# Patient Record
Sex: Female | Born: 1975 | Race: White | Hispanic: No | Marital: Married | Smoking: Former smoker
Health system: Southern US, Community
[De-identification: ages and names within clinical notes are randomized; demographics above are authoritative.]

## PROBLEM LIST (undated history)

## (undated) DIAGNOSIS — IMO0002 Reserved for concepts with insufficient information to code with codable children: Secondary | ICD-10-CM

## (undated) DIAGNOSIS — N189 Chronic kidney disease, unspecified: Secondary | ICD-10-CM

## (undated) DIAGNOSIS — K219 Gastro-esophageal reflux disease without esophagitis: Secondary | ICD-10-CM

## (undated) DIAGNOSIS — I10 Essential (primary) hypertension: Secondary | ICD-10-CM

## (undated) DIAGNOSIS — F419 Anxiety disorder, unspecified: Secondary | ICD-10-CM

## (undated) DIAGNOSIS — T7840XA Allergy, unspecified, initial encounter: Secondary | ICD-10-CM

## (undated) DIAGNOSIS — D649 Anemia, unspecified: Secondary | ICD-10-CM

## (undated) DIAGNOSIS — F39 Unspecified mood [affective] disorder: Secondary | ICD-10-CM

## (undated) DIAGNOSIS — F32A Depression, unspecified: Secondary | ICD-10-CM

## (undated) DIAGNOSIS — F329 Major depressive disorder, single episode, unspecified: Secondary | ICD-10-CM

## (undated) HISTORY — PX: ABDOMINAL SURGERY: SHX537

## (undated) HISTORY — DX: Allergy, unspecified, initial encounter: T78.40XA

## (undated) HISTORY — DX: Essential (primary) hypertension: I10

## (undated) HISTORY — DX: Anemia, unspecified: D64.9

## (undated) HISTORY — DX: Unspecified mood (affective) disorder: F39

## (undated) HISTORY — DX: Major depressive disorder, single episode, unspecified: F32.9

## (undated) HISTORY — DX: Anxiety disorder, unspecified: F41.9

## (undated) HISTORY — DX: Chronic kidney disease, unspecified: N18.9

## (undated) HISTORY — DX: Gastro-esophageal reflux disease without esophagitis: K21.9

## (undated) HISTORY — DX: Depression, unspecified: F32.A

## (undated) HISTORY — DX: Reserved for concepts with insufficient information to code with codable children: IMO0002

---
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
## 1998-05-04 ENCOUNTER — Other Ambulatory Visit: Admission: RE | Admit: 1998-05-04 | Discharge: 1998-05-04 | Payer: Self-pay | Admitting: Obstetrics and Gynecology

## 2008-04-11 ENCOUNTER — Ambulatory Visit (HOSPITAL_COMMUNITY): Admission: RE | Admit: 2008-04-11 | Discharge: 2008-04-11 | Payer: Self-pay | Admitting: Internal Medicine

## 2009-05-05 ENCOUNTER — Ambulatory Visit (HOSPITAL_COMMUNITY): Admission: RE | Admit: 2009-05-05 | Discharge: 2009-05-05 | Payer: Self-pay | Admitting: Family Medicine

## 2010-07-02 ENCOUNTER — Encounter
Admission: RE | Admit: 2010-07-02 | Discharge: 2010-09-30 | Payer: Self-pay | Source: Home / Self Care | Attending: Surgery | Admitting: Surgery

## 2010-07-09 ENCOUNTER — Ambulatory Visit (HOSPITAL_COMMUNITY): Admission: RE | Admit: 2010-07-09 | Discharge: 2010-07-09 | Payer: Self-pay | Admitting: Surgery

## 2010-07-12 ENCOUNTER — Ambulatory Visit (HOSPITAL_COMMUNITY): Admission: RE | Admit: 2010-07-12 | Discharge: 2010-07-12 | Payer: Self-pay | Admitting: Surgery

## 2010-08-20 ENCOUNTER — Inpatient Hospital Stay (HOSPITAL_COMMUNITY): Admission: RE | Admit: 2010-08-20 | Discharge: 2010-08-22 | Payer: Self-pay | Admitting: Surgery

## 2010-08-20 HISTORY — PX: GASTRIC BYPASS: SHX52

## 2010-08-21 ENCOUNTER — Encounter (INDEPENDENT_AMBULATORY_CARE_PROVIDER_SITE_OTHER): Payer: Self-pay | Admitting: Surgery

## 2010-09-11 ENCOUNTER — Encounter: Admission: RE | Admit: 2010-09-11 | Payer: Self-pay | Source: Home / Self Care | Admitting: Surgery

## 2010-10-15 ENCOUNTER — Encounter: Payer: Self-pay | Admitting: Internal Medicine

## 2010-12-04 LAB — COMPREHENSIVE METABOLIC PANEL
ALT: 29 U/L (ref 0–35)
Alkaline Phosphatase: 66 U/L (ref 39–117)
CO2: 25 mEq/L (ref 19–32)
GFR calc non Af Amer: >60 mL/min (ref >60)
Glucose, Bld: 88 mg/dL (ref 70–99)
Potassium: 3.8 mEq/L (ref 3.5–5.1)
Sodium: 138 mEq/L (ref 135–145)
Total Protein: 7.6 g/dL (ref 6.0–8.3)

## 2010-12-04 LAB — DIFFERENTIAL
Basophils Absolute: 0 10*3/uL (ref 0.0–0.1)
Basophils Absolute: 0 10*3/uL (ref 0.0–0.1)
Basophils Relative: 0 % (ref 0–1)
Basophils Relative: 0 % (ref 0–1)
Eosinophils Absolute: 0 10*3/uL (ref 0.0–0.7)
Eosinophils Absolute: 0.1 10*3/uL (ref 0.0–0.7)
Eosinophils Absolute: 0.2 10*3/uL (ref 0.0–0.7)
Eosinophils Relative: 1 % (ref 0–5)
Lymphocytes Relative: 16 % (ref 12–46)
Lymphs Abs: 1.2 10*3/uL (ref 0.7–4.0)
Monocytes Absolute: 0.7 10*3/uL (ref 0.1–1.0)
Monocytes Absolute: 0.7 10*3/uL (ref 0.1–1.0)
Monocytes Relative: 8 % (ref 3–12)
Neutro Abs: 5.7 10*3/uL (ref 1.7–7.7)
Neutrophils Relative %: 62 % (ref 43–77)

## 2010-12-04 LAB — CBC
HCT: 33.5 % — ABNORMAL LOW (ref 36.0–46.0)
HCT: 41.2 % (ref 36.0–46.0)
Hemoglobin: 14.3 g/dL (ref 12.0–15.0)
MCH: 28 pg (ref 26.0–34.0)
MCH: 29.4 pg (ref 26.0–34.0)
MCHC: 32.8 g/dL (ref 30.0–36.0)
MCHC: 34.3 g/dL (ref 30.0–36.0)
MCV: 85.4 fL (ref 78.0–100.0)
Platelets: 196 10*3/uL (ref 150–400)
RDW: 14 % (ref 11.5–15.5)
RDW: 14.1 % (ref 11.5–15.5)
WBC: 7 10*3/uL (ref 4.0–10.5)
WBC: 8.2 10*3/uL (ref 4.0–10.5)

## 2010-12-04 LAB — PREGNANCY, URINE: Preg Test, Ur: NEGATIVE

## 2010-12-04 LAB — HEMOGLOBIN AND HEMATOCRIT, BLOOD: HCT: 35.6 % — ABNORMAL LOW (ref 36.0–46.0)

## 2011-04-26 ENCOUNTER — Ambulatory Visit (INDEPENDENT_AMBULATORY_CARE_PROVIDER_SITE_OTHER): Payer: Self-pay | Admitting: Surgery

## 2011-05-01 ENCOUNTER — Encounter (INDEPENDENT_AMBULATORY_CARE_PROVIDER_SITE_OTHER): Payer: Self-pay | Admitting: Surgery

## 2011-05-03 ENCOUNTER — Encounter (INDEPENDENT_AMBULATORY_CARE_PROVIDER_SITE_OTHER): Payer: Self-pay | Admitting: Surgery

## 2011-05-03 ENCOUNTER — Ambulatory Visit (INDEPENDENT_AMBULATORY_CARE_PROVIDER_SITE_OTHER): Payer: 59 | Admitting: Surgery

## 2011-05-03 VITALS — BP 120/80 | HR 68 | Temp 97.4°F | Ht 65.0 in | Wt 173.4 lb

## 2011-05-03 DIAGNOSIS — Z9884 Bariatric surgery status: Secondary | ICD-10-CM

## 2011-05-03 DIAGNOSIS — Z09 Encounter for follow-up examination after completed treatment for conditions other than malignant neoplasm: Secondary | ICD-10-CM

## 2011-05-03 NOTE — Progress Notes (Addendum)
Chief Complaint  Patient presents with  . Other    bariatric f/u    Andrea Oliver is a 35 y.o. white female who is a patient of [employer] and comes to me today for followup of RYGB.  Her surgery was 08/20/2010.  Her initial weight was 250 and her BMI was 41.7.  The patient is accompanied with her husband today. We have only seen her for one postop visit in September 05, 2010. She has missed her other followups for various reasons.  Despite her missed appointments, she doing her well from her bypass. She successfully lost about 80 pounds. She occasionally has trouble with eating too fast and nausea from that. She is having no gastrointestinal symptoms. She's having good bowel habits.  The one thing she is missing is not getting of exercise. I stressed that she needs to get the equivalent of walking 3 miles a day, 5 days a week.   Past Medical History  Diagnosis Date  . Chronic kidney disease   . Anemia   . Asthma   . Hypertension     Ecclampsia  . GERD (gastroesophageal reflux disease)   . Depression     Current Outpatient Prescriptions  Medication Sig Dispense Refill  . clonazePAM (KLONOPIN) 1 MG tablet Take 1 mg by mouth 2 (two) times daily as needed.        Marland Kitchen escitalopram (LEXAPRO) 10 MG tablet Take 10 mg by mouth daily.          Allergies  Allergen Reactions  . Aspirin Shortness Of Breath    (Asthma)    SOCIAL and FAMILY HISTORY: Accompanied by husband.  PHYSICAL EXAM: BP 120/80  Pulse 68  Temp(Src) 97.4 F (36.3 C) (Temporal)  Ht 5\' 5"  (1.651 m)  Wt 173 lb 6.4 oz (78.654 kg)  BMI 28.86 kg/m2  Lungs: Clear to auscultation. Heart: Regular rate and rhythm. No murmur. Abdomen: Incision is well-healed. No hernia. No tenderness or mass.    DATA REVIEWED: She had blood work in March 2012 which looked good.  Will check next labs in Nov 2012.  ASSESSMENT and PLAN: 1.  S/P roux Y Gastric bypass. Good weight loss. Understands well her diet  limits. These were all marked for size. She will see me back in November 2012.  I will check her labs when I see her.  #2. History depression. #3. History of asthma, minimal symptoms as adult. #4. Resolution of gastroesophageal reflux disease. #5. History of back trouble. Improved with weight loss.

## 2011-05-03 NOTE — Patient Instructions (Signed)
You're doing very well.  The one thing to work on is increase the amount of exercise you are doing.

## 2011-08-21 ENCOUNTER — Other Ambulatory Visit (INDEPENDENT_AMBULATORY_CARE_PROVIDER_SITE_OTHER): Payer: Self-pay

## 2011-08-21 DIAGNOSIS — K912 Postsurgical malabsorption, not elsewhere classified: Secondary | ICD-10-CM

## 2011-09-04 ENCOUNTER — Ambulatory Visit (INDEPENDENT_AMBULATORY_CARE_PROVIDER_SITE_OTHER): Payer: 59 | Admitting: Surgery

## 2011-10-01 ENCOUNTER — Other Ambulatory Visit (INDEPENDENT_AMBULATORY_CARE_PROVIDER_SITE_OTHER): Payer: Self-pay

## 2011-10-01 ENCOUNTER — Telehealth (INDEPENDENT_AMBULATORY_CARE_PROVIDER_SITE_OTHER): Payer: Self-pay

## 2011-10-01 DIAGNOSIS — K912 Postsurgical malabsorption, not elsewhere classified: Secondary | ICD-10-CM

## 2011-10-01 DIAGNOSIS — Z09 Encounter for follow-up examination after completed treatment for conditions other than malignant neoplasm: Secondary | ICD-10-CM

## 2011-10-01 NOTE — Telephone Encounter (Signed)
Left reminder msg for patient to get lab work completed before her vist on Friday.

## 2011-10-04 ENCOUNTER — Ambulatory Visit (INDEPENDENT_AMBULATORY_CARE_PROVIDER_SITE_OTHER): Payer: 59 | Admitting: Surgery

## 2012-01-07 ENCOUNTER — Other Ambulatory Visit (HOSPITAL_COMMUNITY)
Admission: RE | Admit: 2012-01-07 | Discharge: 2012-01-07 | Disposition: A | Discharge status: Home / Self Care | Payer: Self-pay | Source: Ambulatory Visit | Attending: Obstetrics and Gynecology | Admitting: Obstetrics and Gynecology

## 2012-01-07 DIAGNOSIS — Z1159 Encounter for screening for other viral diseases: Secondary | ICD-10-CM | POA: Insufficient documentation

## 2012-01-07 DIAGNOSIS — Z113 Encounter for screening for infections with a predominantly sexual mode of transmission: Secondary | ICD-10-CM | POA: Insufficient documentation

## 2012-01-07 DIAGNOSIS — Z01419 Encounter for gynecological examination (general) (routine) without abnormal findings: Secondary | ICD-10-CM | POA: Insufficient documentation

## 2012-02-24 ENCOUNTER — Telehealth (INDEPENDENT_AMBULATORY_CARE_PROVIDER_SITE_OTHER): Payer: Self-pay

## 2012-02-24 NOTE — Telephone Encounter (Signed)
I called and left the pt a message to call me.  She needs to have labs drawn before her office visit next week if she hasn't already.

## 2012-02-27 NOTE — Telephone Encounter (Signed)
I called the patient's cell and it's disconnected.  I tried her home # and there was no answer.

## 2012-03-02 ENCOUNTER — Encounter (INDEPENDENT_AMBULATORY_CARE_PROVIDER_SITE_OTHER): Payer: Self-pay

## 2012-03-05 ENCOUNTER — Ambulatory Visit (INDEPENDENT_AMBULATORY_CARE_PROVIDER_SITE_OTHER): Payer: 59 | Admitting: Surgery

## 2012-04-24 ENCOUNTER — Other Ambulatory Visit (INDEPENDENT_AMBULATORY_CARE_PROVIDER_SITE_OTHER): Payer: Self-pay | Admitting: Surgery

## 2012-04-24 LAB — CBC WITH DIFFERENTIAL/PLATELET
Eosinophils Relative: 4 % (ref 0–5)
HCT: 35.2 % — ABNORMAL LOW (ref 36.0–46.0)
Lymphocytes Relative: 25 % (ref 12–46)
Lymphs Abs: 1.4 10*3/uL (ref 0.7–4.0)
MCV: 81.3 fL (ref 78.0–100.0)
Neutro Abs: 3.4 10*3/uL (ref 1.7–7.7)
Platelets: 279 10*3/uL (ref 150–400)
RBC: 4.33 MIL/uL (ref 3.87–5.11)
WBC: 5.5 10*3/uL (ref 4.0–10.5)

## 2012-04-25 LAB — COMPREHENSIVE METABOLIC PANEL
ALT: 24 U/L (ref 0–35)
Albumin: 4.4 g/dL (ref 3.5–5.2)
CO2: 27 mEq/L (ref 19–32)
Calcium: 9.5 mg/dL (ref 8.4–10.5)
Chloride: 106 mEq/L (ref 96–112)
Creat: 0.71 mg/dL (ref 0.50–1.10)
Potassium: 4.3 mEq/L (ref 3.5–5.3)
Total Protein: 6.6 g/dL (ref 6.0–8.3)

## 2012-04-25 LAB — MAGNESIUM: Magnesium: 2.1 mg/dL (ref 1.5–2.5)

## 2012-05-22 ENCOUNTER — Ambulatory Visit (INDEPENDENT_AMBULATORY_CARE_PROVIDER_SITE_OTHER): Payer: 59 | Admitting: Surgery

## 2012-06-27 IMAGING — US US ABDOMEN COMPLETE
1 series · 14 of 25 positions shown · non-contrast
Comparison: None.

CLINICAL DATA: Morbid obesity.  Pre-op evaluation for bariatric
surgery.

ABDOMINAL ULTRASOUND COMPLETE

[Series 1: us abdomen complete · 0.32mm/px · 14 of 94 slices shown]
[im 1/94]
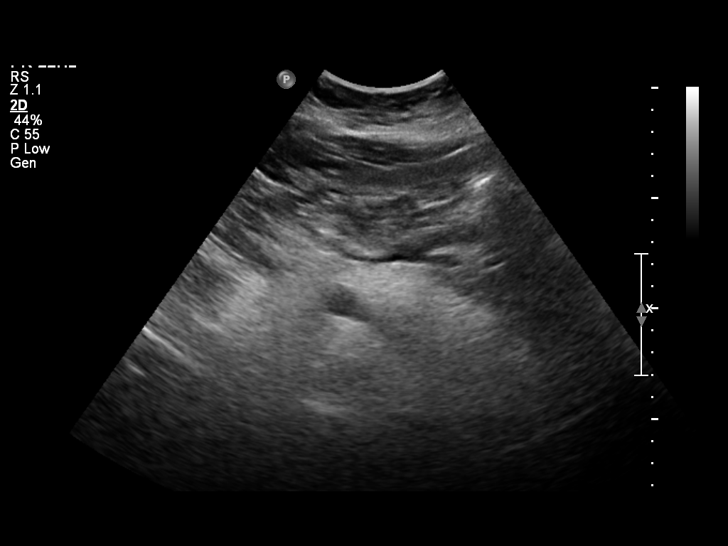
[im 8/94]
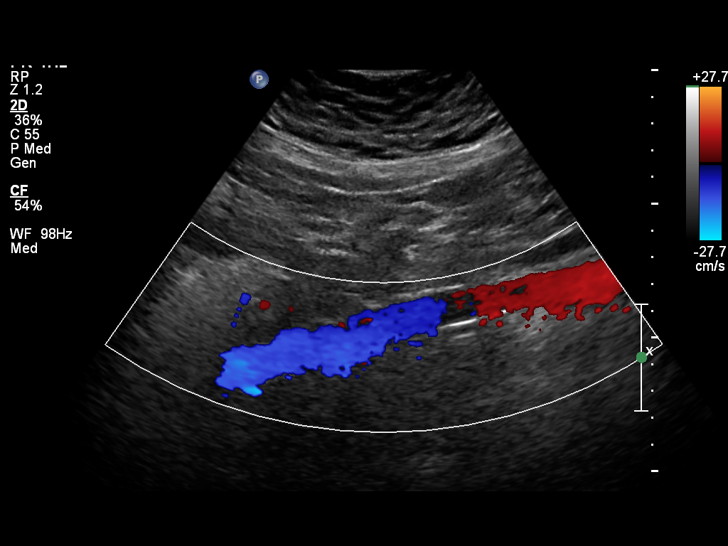
[im 16/94]
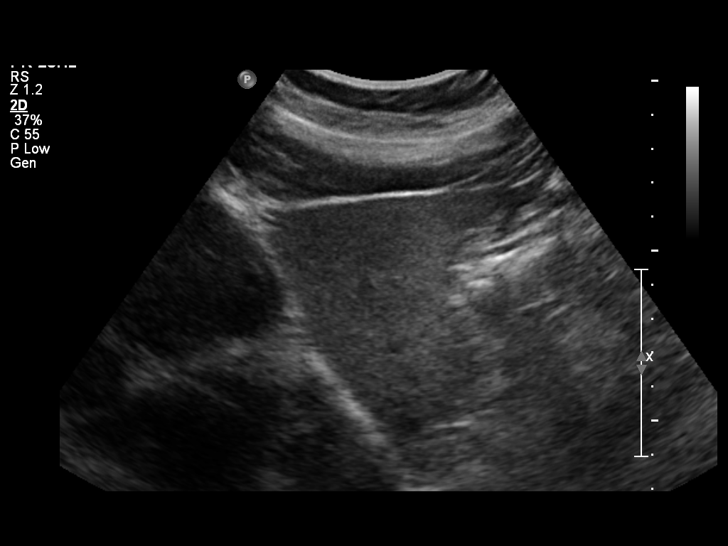
[im 24/94]
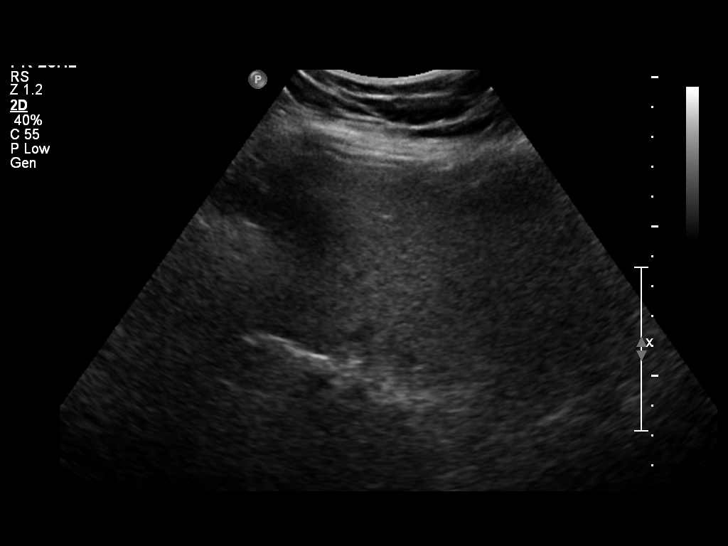
[im 32/94]
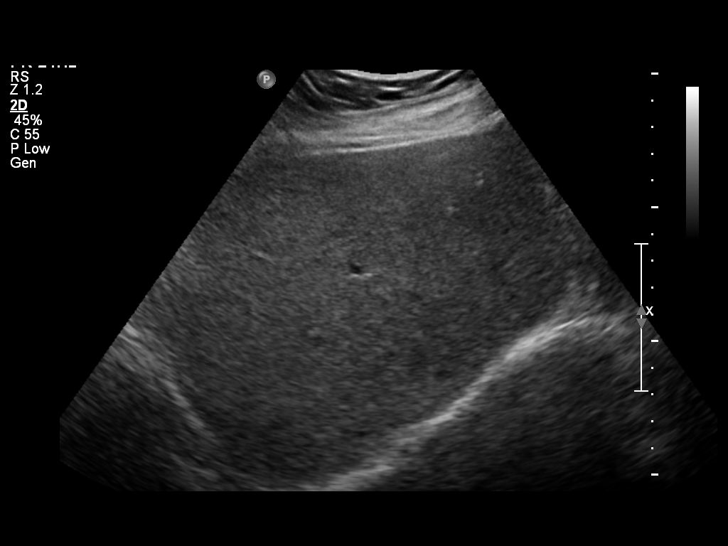
[im 35/94]
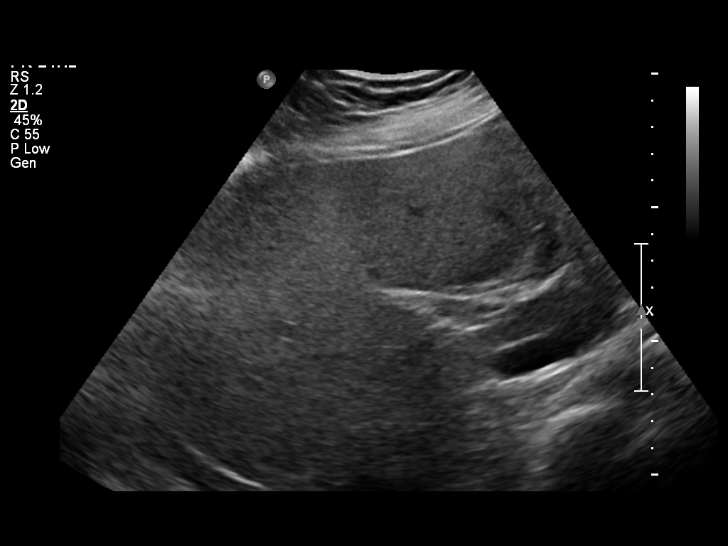
[im 43/94]
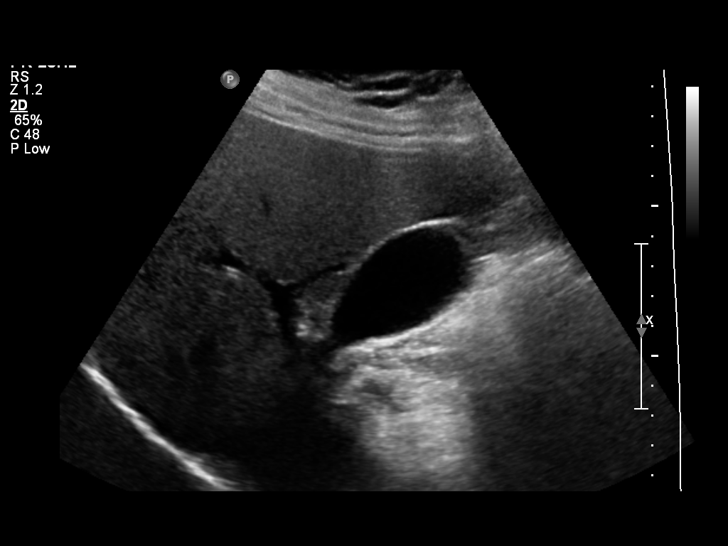
[im 51/94]
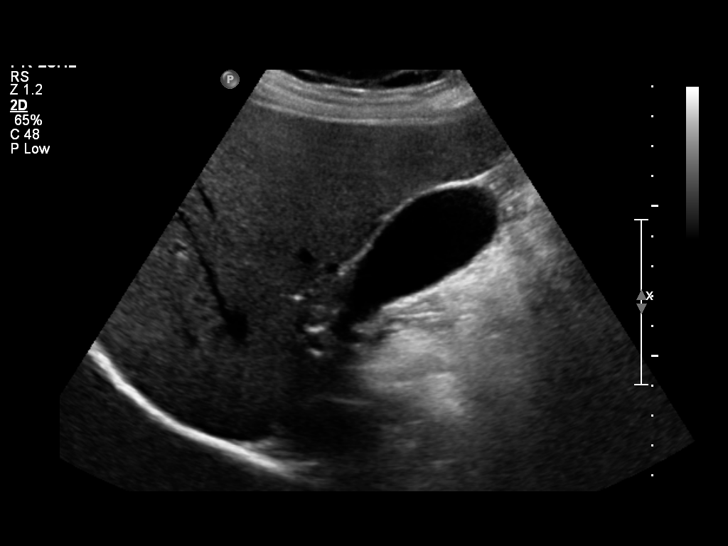
[im 59/94]
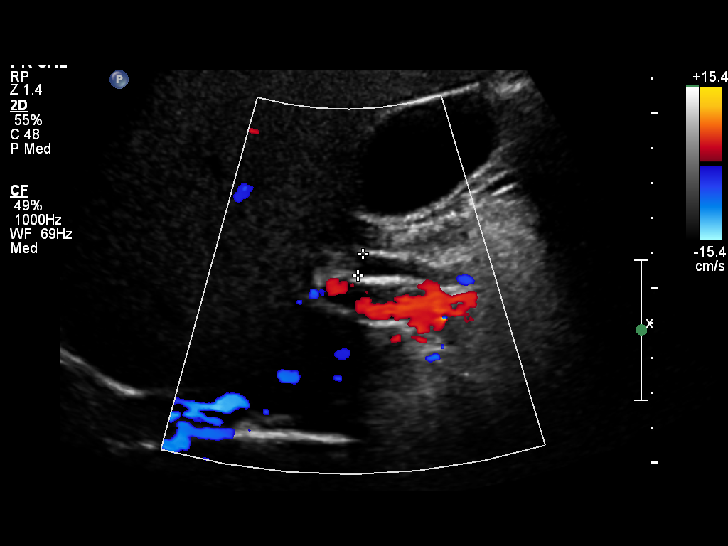
[im 63/94]
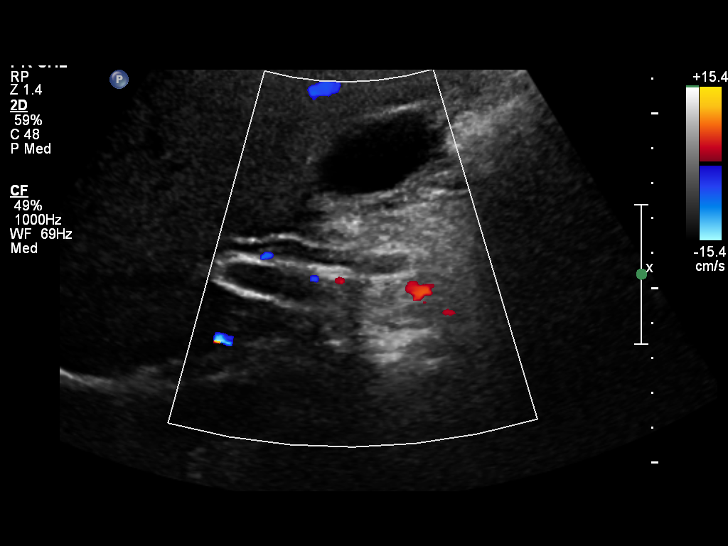
[im 70/94]
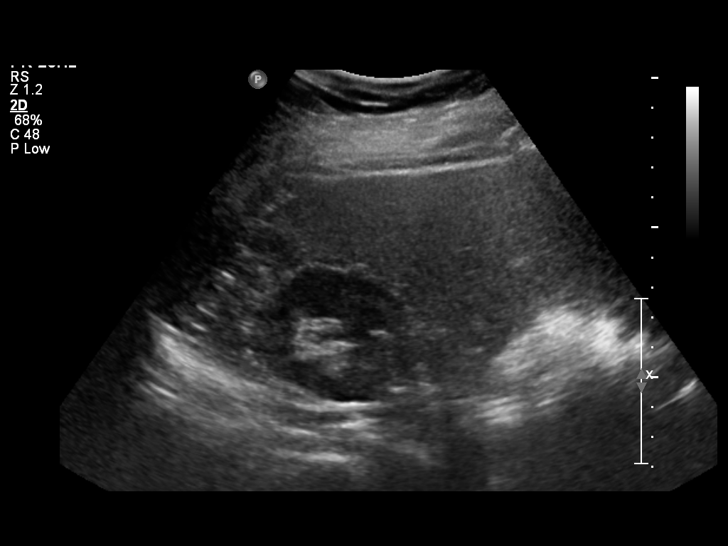
[im 78/94]
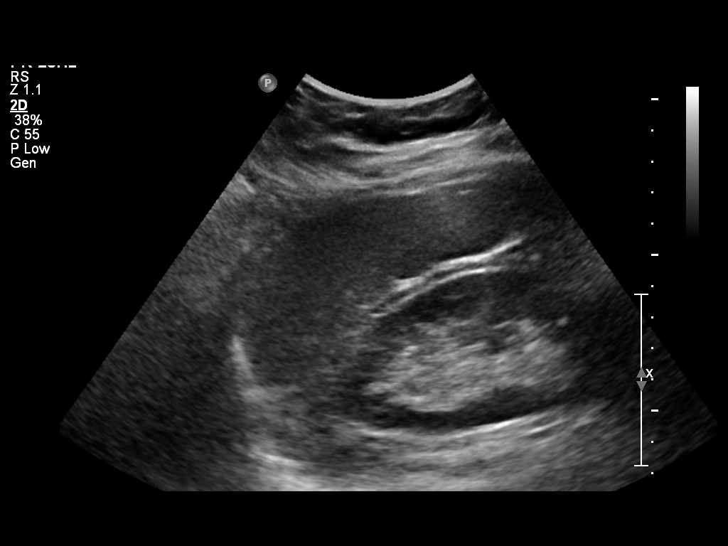
[im 86/94]
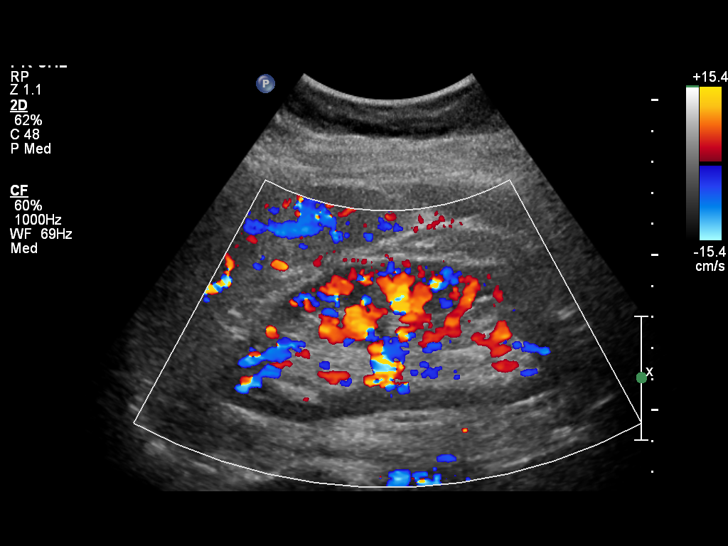
[im 94/94]
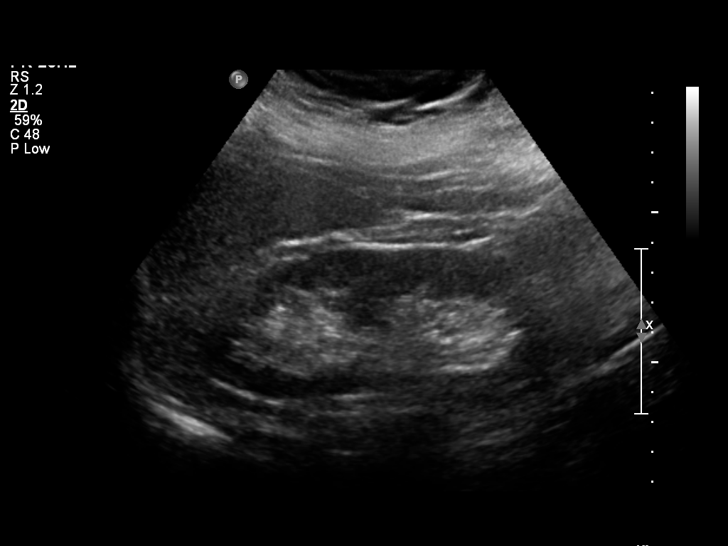

[14 of 25 positions shown; findings below may reference images not displayed]

FINDINGS: Gallbladder:  No gallstones, gallbladder wall thickening, or
pericholecystic fluid.

Common Bile Duct:  Within normal limits in caliber.  Measures 6 mm
in maximum diameter.

Liver: Diffusely increased parenchymal echogenicity, consistent
with hepatic steatosis.  No focal liver mass identified.

IVC:  Appears normal.

Pancreas:  No abnormality identified.

Spleen:  Within normal limits in size and echotexture.

Right kidney:  Normal in size and parenchymal echogenicity.  No
evidence of mass or hydronephrosis.

Left kidney:  Normal in size and parenchymal echogenicity.  No
evidence of mass or hydronephrosis.

Abdominal Aorta:  No aneurysm identified.
IMPRESSION: 1.  No evidence of gallstones or biliary dilatation.
2.  Mild hepatic steatosis.

## 2012-06-27 IMAGING — CR DG CHEST 2V
2 series · 2 of 2 positions shown · non-contrast
Comparison: None.

CLINICAL DATA: Morbid obesity.  Pre-op evaluation for bariatric
surgery.

CHEST - 2 VIEW

[view not recorded (1 of 2)]
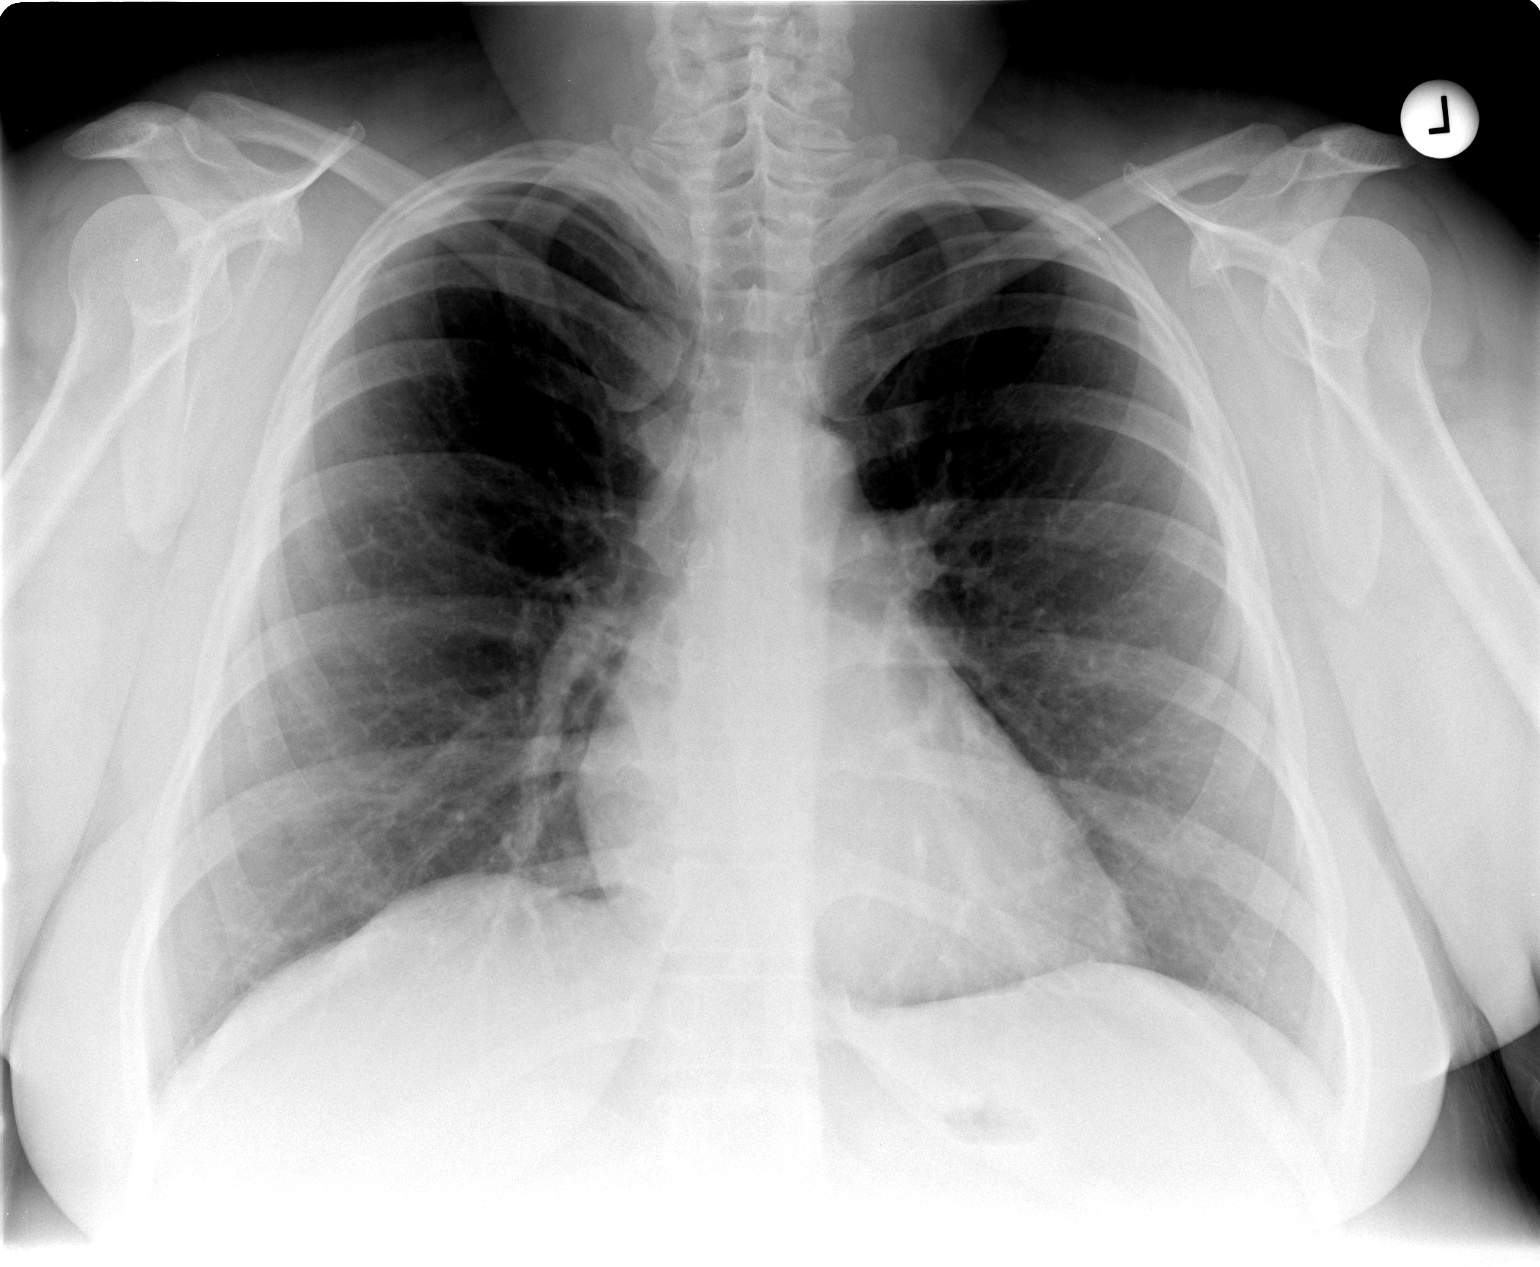

[view not recorded (2 of 2)]
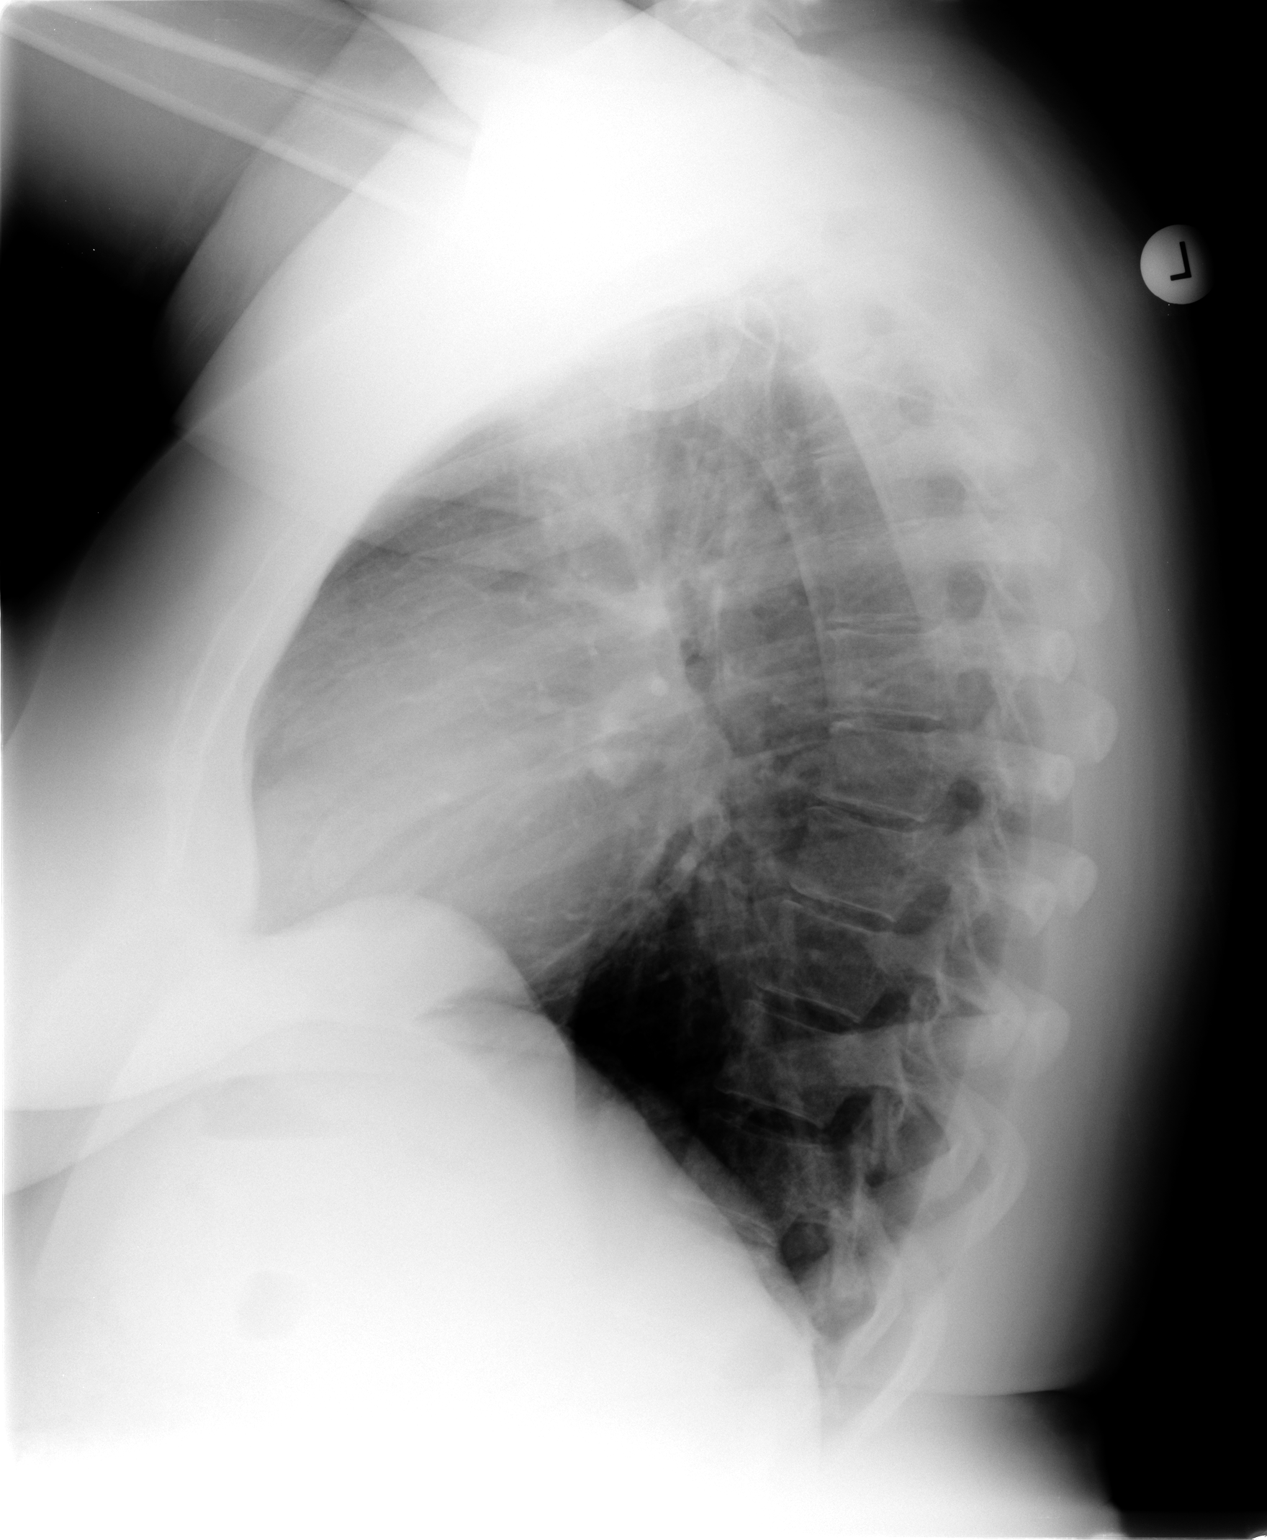

[2 of 2 positions shown; findings below may reference images not displayed]

FINDINGS: The heart size and mediastinal contours are within
normal limits.  Both lungs are clear.  The visualized skeletal
structures are unremarkable.
IMPRESSION: No active cardiopulmonary disease.

## 2012-12-29 ENCOUNTER — Ambulatory Visit: Payer: 59

## 2012-12-29 ENCOUNTER — Ambulatory Visit: Payer: 59 | Admitting: Family Medicine

## 2012-12-29 VITALS — BP 144/77 | HR 73 | Temp 98.3°F | Resp 16 | Ht 65.0 in | Wt 213.2 lb

## 2012-12-29 DIAGNOSIS — M549 Dorsalgia, unspecified: Secondary | ICD-10-CM

## 2012-12-29 DIAGNOSIS — S3210XA Unspecified fracture of sacrum, initial encounter for closed fracture: Secondary | ICD-10-CM

## 2012-12-29 DIAGNOSIS — S322XXA Fracture of coccyx, initial encounter for closed fracture: Secondary | ICD-10-CM

## 2012-12-29 MED ORDER — CYCLOBENZAPRINE HCL 5 MG PO TABS: 5.0000 mg | ORAL_TABLET | Freq: Three times a day (TID) | ORAL | Status: DC | PRN

## 2012-12-29 MED ORDER — HYDROCODONE-ACETAMINOPHEN 5-325 MG PO TABS: 1.0000 | ORAL_TABLET | Freq: Four times a day (QID) | ORAL | Status: DC | PRN

## 2012-12-29 MED ORDER — TRAMADOL HCL 50 MG PO TABS: 50.0000 mg | ORAL_TABLET | Freq: Three times a day (TID) | ORAL | Status: DC | PRN

## 2012-12-29 NOTE — Progress Notes (Signed)
 [facility]:  Office Visit  Chief Complaint:  Chief Complaint  Patient presents with  . Back Pain    3 days pain lower back, tailbone, into back of legs    HPI: Andrea Oliver is a 37 y.o. female who complains of  3 day history of back pain , fell off barstool Saturday, was about 3 feet tall. Landed on tailbone. Achey pain going down both posterior thighs when sitting. Hurts more when she leans back. Has tried tylenol, can't take ibuprofen because of h/o ulcer. No prior back issues, except fell 2-3 years ago. No incontinence. No swelling or bruising.   Past Medical History  Diagnosis Date  . Chronic kidney disease   . Anemia   . Asthma   . Hypertension     Ecclampsia  . GERD (gastroesophageal reflux disease)   . Depression   . Allergy   . Anxiety   . Ulcer   . Ulcer    Past Surgical History  Procedure Laterality Date  . Cesarean section  2000  . Knee surgery  1996    left  . Gastric bypass  08-20-10   History   Social History  . Marital Status: Married    Spouse Name: N/A    Number of Children: N/A  . Years of Education: N/A   Social History Main Topics  . Smoking status: Former Games developer  . Smokeless tobacco: None  . Alcohol Use: 0.0 oz/week    3-4 Glasses of wine per week  . Drug Use: No  . Sexually Active: Yes     Comment: vascetomy   Other Topics Concern  . None   Social History Narrative  . None   Family History  Problem Relation Age of Onset  . Hypertension Mother    Allergies  Allergen Reactions  . Aspirin Shortness Of Breath    (Asthma)  . Nsaids Other (See Comments)    Ulcer disease   Prior to Admission medications   Medication Sig Start Date End Date Taking? Authorizing Provider  Melatonin 1 MG CAPS Take by mouth.   Yes Historical Provider, MD  pantoprazole (PROTONIX) 20 MG tablet Take 20 mg by mouth daily.   Yes Historical Provider, MD  clonazePAM (KLONOPIN) 1 MG tablet Take 1 mg by mouth 2 (two) times daily as  needed.      Historical Provider, MD  escitalopram (LEXAPRO) 10 MG tablet Take 10 mg by mouth daily.      Historical Provider, MD     ROS: The patient denies fevers, chills, night sweats, unintentional weight loss, chest pain, palpitations, wheezing, dyspnea on exertion, nausea, vomiting, abdominal pain, dysuria, hematuria, melena, numbness, weakness, or tingling.   All other systems have been reviewed and were otherwise negative with the exception of those mentioned in the HPI and as above.    PHYSICAL EXAM: Filed Vitals:   12/29/12 1817  BP: 144/77  Pulse: 73  Temp: 98.3 F (36.8 C)  Resp: 16   Filed Vitals:   12/29/12 1817  Height: 5\' 5"  (1.651 m)  Weight: 213 lb 3.2 oz (96.707 kg)   Body mass index is 35.48 kg/(m^2).  General: Alert, no acute distress HEENT:  Normocephalic, atraumatic, oropharynx patent.  Cardiovascular:  Regular rate and rhythm, no rubs murmurs or gallops.  No Carotid bruits, radial pulse intact. No pedal edema.  Respiratory: Clear to auscultation bilaterally.  No wheezes, rales, or rhonchi.  No cyanosis, no use of accessory musculature GI: No organomegaly,  abdomen is soft and non-tender, positive bowel sounds.  No masses. Skin: No rashes. Neurologic: Facial musculature symmetric. Psychiatric: Patient is appropriate throughout our interaction. Lymphatic: No cervical lymphadenopathy Musculoskeletal: Gait intact. + tender at coccyx, able to have full ROM of low back but tender on right side LB No saddle anesthesia. 2/2 DTRs. 5/5 strength  LABS: Results for orders placed in visit on 04/24/12  CBC WITH DIFFERENTIAL      Result Value Range   WBC 5.5  4.0 - 10.5 K/uL   RBC 4.33  3.87 - 5.11 MIL/uL   Hemoglobin 11.8 (*) 12.0 - 15.0 g/dL   HCT 04.5 (*) 40.9 - 81.1 %   MCV 81.3  78.0 - 100.0 fL   MCH 27.3  26.0 - 34.0 pg   MCHC 33.5  30.0 - 36.0 g/dL   RDW 91.4  78.2 - 95.6 %   Platelets 279  150 - 400 K/uL   Neutrophils Relative 62  43 - 77 %   Neutro  Abs 3.4  1.7 - 7.7 K/uL   Lymphocytes Relative 25  12 - 46 %   Lymphs Abs 1.4  0.7 - 4.0 K/uL   Monocytes Relative 9  3 - 12 %   Monocytes Absolute 0.5  0.1 - 1.0 K/uL   Eosinophils Relative 4  0 - 5 %   Eosinophils Absolute 0.2  0.0 - 0.7 K/uL   Basophils Relative 0  0 - 1 %   Basophils Absolute 0.0  0.0 - 0.1 K/uL   Smear Review Criteria for review not met    COMPREHENSIVE METABOLIC PANEL      Result Value Range   Sodium 142  135 - 145 mEq/L   Potassium 4.3  3.5 - 5.3 mEq/L   Chloride 106  96 - 112 mEq/L   CO2 27  19 - 32 mEq/L   Glucose, Bld 113 (*) 70 - 99 mg/dL   BUN 12  6 - 23 mg/dL   Creat 2.13  0.86 - 5.78 mg/dL   Total Bilirubin 0.4  0.3 - 1.2 mg/dL   Alkaline Phosphatase 73  39 - 117 U/L   AST 72 (*) 0 - 37 U/L   ALT 24  0 - 35 U/L   Total Protein 6.6  6.0 - 8.3 g/dL   Albumin 4.4  3.5 - 5.2 g/dL   Calcium 9.5  8.4 - 46.9 mg/dL  MAGNESIUM      Result Value Range   Magnesium 2.1  1.5 - 2.5 mg/dL  VITAMIN G29      Result Value Range   Vitamin B-12 1241 (*) 211 - 911 pg/mL  FOLATE      Result Value Range   Folate >20.0    FERRITIN      Result Value Range   Ferritin 7 (*) 10 - 291 ng/mL  VITAMIN B1      Result Value Range   Vitamin B1 (Thiamine) 48 (*) 8 - 30 nmol/L     EKG/XRAY:   Primary read interpreted by Dr. Conley Rolls at [facility]. + coccyx fracture/? Dislocation at 2-3   ASSESSMENT/PLAN: Encounter Diagnoses  Name Primary?  . Acute back pain Yes  . Fracture of coccyx, closed, initial encounter    Rx Tramadol Rx  Norco Rx Flexeril F/u in 4-6 weeks.  Precautions for constipation 12/30/12: Spoke to patient about official xray results.     ,  PHUONG, DO 12/29/2012 8:02 PM

## 2013-01-11 ENCOUNTER — Other Ambulatory Visit: Payer: Self-pay | Admitting: Family Medicine

## 2013-01-12 ENCOUNTER — Other Ambulatory Visit: Payer: Self-pay | Admitting: Family Medicine

## 2013-01-25 ENCOUNTER — Other Ambulatory Visit: Payer: Self-pay | Admitting: Family Medicine

## 2013-02-01 ENCOUNTER — Other Ambulatory Visit: Payer: Self-pay | Admitting: Family Medicine

## 2013-02-02 ENCOUNTER — Other Ambulatory Visit: Payer: Self-pay | Admitting: Radiology

## 2013-02-17 ENCOUNTER — Other Ambulatory Visit: Payer: Self-pay | Admitting: Family Medicine

## 2013-02-18 ENCOUNTER — Other Ambulatory Visit (HOSPITAL_COMMUNITY)
Admission: RE | Admit: 2013-02-18 | Discharge: 2013-02-18 | Disposition: A | Discharge status: Home / Self Care | Payer: 59 | Source: Ambulatory Visit | Attending: Obstetrics and Gynecology | Admitting: Obstetrics and Gynecology

## 2013-02-18 ENCOUNTER — Other Ambulatory Visit: Payer: Self-pay | Admitting: Nurse Practitioner

## 2013-02-18 DIAGNOSIS — Z1151 Encounter for screening for human papillomavirus (HPV): Secondary | ICD-10-CM | POA: Insufficient documentation

## 2013-02-18 DIAGNOSIS — Z01419 Encounter for gynecological examination (general) (routine) without abnormal findings: Secondary | ICD-10-CM | POA: Insufficient documentation

## 2013-12-25 ENCOUNTER — Emergency Department (HOSPITAL_COMMUNITY)
Admission: EM | Admit: 2013-12-25 | Discharge: 2013-12-26 | Disposition: A | Discharge status: Other Acute Inpatient Hospital | Payer: 59 | Attending: Emergency Medicine | Admitting: Emergency Medicine

## 2013-12-25 ENCOUNTER — Encounter (HOSPITAL_COMMUNITY): Payer: Self-pay | Admitting: Emergency Medicine

## 2013-12-25 DIAGNOSIS — F3289 Other specified depressive episodes: Secondary | ICD-10-CM | POA: Insufficient documentation

## 2013-12-25 DIAGNOSIS — F1010 Alcohol abuse, uncomplicated: Secondary | ICD-10-CM | POA: Insufficient documentation

## 2013-12-25 DIAGNOSIS — K219 Gastro-esophageal reflux disease without esophagitis: Secondary | ICD-10-CM | POA: Insufficient documentation

## 2013-12-25 DIAGNOSIS — Z87891 Personal history of nicotine dependence: Secondary | ICD-10-CM | POA: Insufficient documentation

## 2013-12-25 DIAGNOSIS — Z3202 Encounter for pregnancy test, result negative: Secondary | ICD-10-CM | POA: Insufficient documentation

## 2013-12-25 DIAGNOSIS — F1310 Sedative, hypnotic or anxiolytic abuse, uncomplicated: Secondary | ICD-10-CM | POA: Insufficient documentation

## 2013-12-25 DIAGNOSIS — F329 Major depressive disorder, single episode, unspecified: Secondary | ICD-10-CM | POA: Insufficient documentation

## 2013-12-25 DIAGNOSIS — I129 Hypertensive chronic kidney disease with stage 1 through stage 4 chronic kidney disease, or unspecified chronic kidney disease: Secondary | ICD-10-CM | POA: Insufficient documentation

## 2013-12-25 DIAGNOSIS — R45851 Suicidal ideations: Secondary | ICD-10-CM | POA: Insufficient documentation

## 2013-12-25 DIAGNOSIS — N189 Chronic kidney disease, unspecified: Secondary | ICD-10-CM | POA: Insufficient documentation

## 2013-12-25 DIAGNOSIS — Z862 Personal history of diseases of the blood and blood-forming organs and certain disorders involving the immune mechanism: Secondary | ICD-10-CM | POA: Insufficient documentation

## 2013-12-25 DIAGNOSIS — J45909 Unspecified asthma, uncomplicated: Secondary | ICD-10-CM | POA: Insufficient documentation

## 2013-12-25 DIAGNOSIS — F411 Generalized anxiety disorder: Secondary | ICD-10-CM | POA: Insufficient documentation

## 2013-12-25 NOTE — ED Notes (Signed)
Pt states she is suicidal. Has hx of depression. Came in w/ parents. Pt states she wants to die painless if possible. Stated there are many ways to do it.
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
## 2013-12-26 ENCOUNTER — Inpatient Hospital Stay (HOSPITAL_COMMUNITY)
Admission: AD | Admit: 2013-12-26 | Discharge: 2013-12-30 | DRG: 885 | Disposition: A | Discharge status: Home / Self Care | Payer: Medicaid Other | Source: Intra-hospital | Attending: Psychiatry | Admitting: Psychiatry

## 2013-12-26 ENCOUNTER — Encounter (HOSPITAL_COMMUNITY): Payer: Self-pay

## 2013-12-26 DIAGNOSIS — F39 Unspecified mood [affective] disorder: Secondary | ICD-10-CM

## 2013-12-26 DIAGNOSIS — F3130 Bipolar disorder, current episode depressed, mild or moderate severity, unspecified: Secondary | ICD-10-CM

## 2013-12-26 DIAGNOSIS — F3181 Bipolar II disorder: Secondary | ICD-10-CM | POA: Diagnosis present

## 2013-12-26 DIAGNOSIS — N189 Chronic kidney disease, unspecified: Secondary | ICD-10-CM | POA: Diagnosis present

## 2013-12-26 DIAGNOSIS — F1310 Sedative, hypnotic or anxiolytic abuse, uncomplicated: Secondary | ICD-10-CM

## 2013-12-26 DIAGNOSIS — G4700 Insomnia, unspecified: Secondary | ICD-10-CM | POA: Diagnosis present

## 2013-12-26 DIAGNOSIS — F10229 Alcohol dependence with intoxication, unspecified: Secondary | ICD-10-CM

## 2013-12-26 DIAGNOSIS — I129 Hypertensive chronic kidney disease with stage 1 through stage 4 chronic kidney disease, or unspecified chronic kidney disease: Secondary | ICD-10-CM | POA: Diagnosis present

## 2013-12-26 DIAGNOSIS — Z87891 Personal history of nicotine dependence: Secondary | ICD-10-CM

## 2013-12-26 DIAGNOSIS — F3110 Bipolar disorder, current episode manic without psychotic features, unspecified: Principal | ICD-10-CM | POA: Diagnosis present

## 2013-12-26 DIAGNOSIS — F329 Major depressive disorder, single episode, unspecified: Secondary | ICD-10-CM | POA: Diagnosis present

## 2013-12-26 DIAGNOSIS — F5081 Binge eating disorder: Secondary | ICD-10-CM | POA: Diagnosis present

## 2013-12-26 HISTORY — DX: Unspecified mood (affective) disorder: F39

## 2013-12-26 LAB — COMPREHENSIVE METABOLIC PANEL
ALK PHOS: 91 U/L (ref 39–117)
ALT: 8 U/L (ref 0–35)
AST: 16 U/L (ref 0–37)
Albumin: 3.8 g/dL (ref 3.5–5.2)
BILIRUBIN TOTAL: 0.3 mg/dL (ref 0.3–1.2)
BUN: 7 mg/dL (ref 6–23)
CO2: 22 meq/L (ref 19–32)
Calcium: 9.1 mg/dL (ref 8.4–10.5)
Chloride: 104 mEq/L (ref 96–112)
Creatinine, Ser: 0.77 mg/dL (ref 0.50–1.10)
GFR calc Af Amer: >90 mL/min (ref >90)
Glucose, Bld: 94 mg/dL (ref 70–99)
POTASSIUM: 3.8 meq/L (ref 3.7–5.3)
SODIUM: 141 meq/L (ref 137–147)
Total Protein: 7.3 g/dL (ref 6.0–8.3)

## 2013-12-26 LAB — CBC
HCT: 35.4 % — ABNORMAL LOW (ref 36.0–46.0)
Hemoglobin: 11.4 g/dL — ABNORMAL LOW (ref 12.0–15.0)
MCH: 24.4 pg — ABNORMAL LOW (ref 26.0–34.0)
MCHC: 32.2 g/dL (ref 30.0–36.0)
MCV: 75.8 fL — AB (ref 78.0–100.0)
Platelets: 295 10*3/uL (ref 150–400)
RBC: 4.67 MIL/uL (ref 3.87–5.11)
RDW: 14.2 % (ref 11.5–15.5)
WBC: 7.4 10*3/uL (ref 4.0–10.5)

## 2013-12-26 LAB — ETHANOL: Alcohol, Ethyl (B): 122 mg/dL — ABNORMAL HIGH (ref 0–11)

## 2013-12-26 LAB — RAPID URINE DRUG SCREEN, HOSP PERFORMED
AMPHETAMINES: NOT DETECTED
BARBITURATES: NOT DETECTED
Benzodiazepines: POSITIVE — AB
Cocaine: NOT DETECTED
Opiates: NOT DETECTED
TETRAHYDROCANNABINOL: NOT DETECTED

## 2013-12-26 LAB — ACETAMINOPHEN LEVEL: Acetaminophen (Tylenol), Serum: <15 ug/mL (ref 10–30)

## 2013-12-26 LAB — POC URINE PREG, ED: Preg Test, Ur: NEGATIVE

## 2013-12-26 LAB — SALICYLATE LEVEL: Salicylate Lvl: <2 mg/dL — ABNORMAL LOW (ref 2.8–20.0)

## 2013-12-26 MED ORDER — ALUM & MAG HYDROXIDE-SIMETH 200-200-20 MG/5ML PO SUSP: 30.0000 mL | ORAL | Status: DC | PRN

## 2013-12-26 MED ORDER — HYDROXYZINE HCL 25 MG PO TABS
25.0000 mg | ORAL_TABLET | Freq: Four times a day (QID) | ORAL | Status: DC | PRN
  Administered 2013-12-26: 25 mg via ORAL
  Filled 2013-12-26: qty 1

## 2013-12-26 MED ORDER — TRAZODONE HCL 50 MG PO TABS
50.0000 mg | ORAL_TABLET | Freq: Every evening | ORAL | Status: DC | PRN
Start: 2013-12-26 — End: 2013-12-30
  Administered 2013-12-27 – 2013-12-29 (×3): 50 mg via ORAL
  Filled 2013-12-26 (×3): qty 1
  Filled 2013-12-26: qty 3
  Filled 2013-12-26: qty 1

## 2013-12-26 MED ORDER — ACETAMINOPHEN 325 MG PO TABS
650.0000 mg | ORAL_TABLET | Freq: Four times a day (QID) | ORAL | Status: DC | PRN
  Administered 2013-12-27 – 2013-12-30 (×2): 650 mg via ORAL
  Filled 2013-12-26 (×2): qty 2

## 2013-12-26 MED ORDER — MAGNESIUM HYDROXIDE 400 MG/5ML PO SUSP: 30.0000 mL | Freq: Every day | ORAL | Status: DC | PRN

## 2013-12-26 MED ORDER — FLUOXETINE HCL 20 MG PO CAPS
20.0000 mg | ORAL_CAPSULE | Freq: Every day | ORAL | Status: DC
  Administered 2013-12-26 – 2013-12-30 (×4): 20 mg via ORAL
  Filled 2013-12-26: qty 3
  Filled 2013-12-26 (×8): qty 1
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
## 2013-12-26 NOTE — Progress Notes (Signed)
Psychoeducational Group Note  Date:  12/26/2013 Time:  1015  Group Topic/Focus:  Making Healthy Choices:   The focus of this group is to help patients identify negative/unhealthy choices they were using prior to admission and identify positive/healthier coping strategies to replace them upon discharge.  Participation Level: Did not Karl ItoattendJudge, Serayah Yazdani A 12/26/2013

## 2013-12-26 NOTE — Progress Notes (Addendum)
Psychoeducational Group Note  Date: 12/26/2013 Time:  0930  Group Topic/Focus:  Gratefulness:  The focus of this group is to help patients identify what two things they are most grateful for in their lives. What helps ground them and to center them on their work to their recovery.  Participation Level: Didn't attend

## 2013-12-26 NOTE — BH Assessment (Signed)
Tele-assessment completed. Consulted with Alberteen SamFran Hobson, NP who agrees pt meets inpatient criteria. Pt will need a full set of vitals prior to being considered for placement. Tenna DelaineLori Hutchens, RN notified of the request for vitals.  Vitals have been completed and [facility] Bed 1. Dr. Dierdre Highmanpitz has been notified.

## 2013-12-26 NOTE — Progress Notes (Signed)
D.  Pt pleasant on approach, states that she is hoping to go to bed early.  Pt with minimal interacting this evening, staying mostly in room.  Denies SI/HI/hallucinations at this time.  A.  Support and encouragement offered, medications given as ordered for anxiety and sleep.  R.  Pt remains safe on the unit, will continue to monitor.

## 2013-12-26 NOTE — [facility] Suicide Risk Assessment (Signed)
Suicide Risk Assessment  Admission Assessment     Nursing information obtained from:  Patient Demographic factors:  Low socioeconomic status Current Mental Status:  Suicidal ideation indicated by patient Loss Factors:  Legal issues;Financial problems / change in socioeconomic status Historical Factors:  Family history of mental illness or substance abuse;NA Risk Reduction Factors:    Total Time spent with patient: 1 hour  CLINICAL FACTORS:   Bipolar Disorder:   Depressive phase  Psychiatric Specialty Exam:     Blood pressure 126/87, pulse 127, temperature 98 F (36.7 C), temperature source Oral, resp. rate 16, height 5\' 5"  (1.651 m), weight 100.245 kg (221 lb), last menstrual period 12/25/2013, SpO2 99.00%.Body mass index is 36.78 kg/(m^2).  General Appearance: Fairly Groomed  Patent attorneyye Contact::  Fair  Speech:  Clear and Coherent  Volume:  Normal  Mood:  Anxious, Depressed and worried  Affect:  Restricted  Thought Process:  Coherent and Goal Directed  Orientation:  Full (Time, Place, and Person)  Thought Content:  symtpoms, worries, concerns  Suicidal Thoughts:  No  Homicidal Thoughts:  No  Memory:  Immediate;   Fair Recent;   Fair Remote;   Fair  Judgement:  Fair  Insight:  Present  Psychomotor Activity:  Restlessness  Concentration:  Fair  Recall:  Fis[employer]Fair  Fund of Knowledge:NA  Language: Fair  Akathisia:  No  Handed:    AIMS (if indicated):     Assets:  Desire for Improvement Housing Talents/Skills  Sleep:      Musculoskeletal: Strength & Muscle Tone: within normal limits Gait & Station: normal Patient leans: N/A  COGNITIVE FEATURES THAT CONTRIBUTE TO RISK:  Closed-mindedness Polarized thinking Thought constriction (tunnel vision)    SUICIDE RISK:   Moderate:   PLAN OF CARE: Supportive approach/coping skills                               Clarify diagnosis Bipolar Depression vs. Unipolar Depression                               Optimize treatment with  psychotropics  I certify that inpatient services furnished can reasonably be expected to improve the patient's condition.  Jewel Venditto A 12/26/2013, 6:09 PM

## 2013-12-26 NOTE — Progress Notes (Signed)
D) This is the first admission for this 38 year old female who is voluntarily admitted to the service of Dr. Carmelina DaneJonnalagada. [facility]. Pt's mother brought Pt there after Pt took "several Klonopin and drank a couple of beer". Affect is flat and mood depressed. Pt admits to thoughts of SI without a plan. States taht she has been up all night and requested to sleep for a couple of hours today. A) Pt oriented to the unit. Given support, reassurance and praise. Encouragement given. Pt oriented to the unit, offered food and something to drink. Made comfortable. R) Pt continues to have thoughts of SI, is hopelessness and helpless. Contracts for safety.
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
## 2013-12-26 NOTE — ED Notes (Signed)
[facility] transport called for transfer.  

## 2013-12-26 NOTE — ED Notes (Signed)
[facility]

## 2013-12-26 NOTE — Progress Notes (Signed)
Adult Psychoeducational Group Note  Date:  12/26/2013 Time:  11:27 PM  Group Topic/Focus:  Wrap-Up Group:   The focus of this group is to help patients review their daily goal of treatment and discuss progress on daily workbooks.  Participation Level:  Did Not Attend  Additional Comments:  Pt was invited to group but pt declined attending as it was her first night here.  Dalia HeadingSeeley, Tameyah Koch Aileen 12/26/2013, 11:27 PM

## 2013-12-27 DIAGNOSIS — F3181 Bipolar II disorder: Secondary | ICD-10-CM | POA: Diagnosis present

## 2013-12-27 MED ORDER — QUETIAPINE FUMARATE ER 200 MG PO TB24
200.0000 mg | ORAL_TABLET | Freq: Every day | ORAL | Status: DC
  Administered 2013-12-27 – 2013-12-29 (×3): 200 mg via ORAL
  Filled 2013-12-27 (×5): qty 1
  Filled 2013-12-27: qty 3

## 2013-12-27 NOTE — [facility] Group Notes (Signed)
[facility]   12/27/2013 8:45 AM  Participation Quality:  Alert, Appropriate and Oriented  Mood/Affect:  Flat and Depressed  Depression Rating:  3  Anxiety Rating:  2  Thoughts of Suicide:  Pt denies SI/HI  Will you contract for safety?   Yes  Current AVH:  Pt denies  Plan for Discharge/Comments:  Pt attended discharge planning group and actively participated in group.  CSW provided pt with today's workbook.  Pt states that she wants to d/c soon.  [place].  Pt states that she is missing work but her husband is letting them know she's in the hospital.  Pt states that she's seen Dr. Tomasa Randunningham in the past but wants a new referral for outpatient provider.  CSW will assess for appropriate referrals.  No further needs voiced by pt at this time.    Transportation Means: Pt reports access to transportation  Supports: No supports mentioned at this time  Reyes IvanChelsea Horton, LCSW 12/27/2013 10:11 AM

## 2013-12-27 NOTE — Progress Notes (Signed)
D:  Patient's self inventory sheet, patient has poor sleep, poor appetite, low energy level, poor attention span.  Rated depression 5, anxiety 2, hopeless 1.  Denied withdrawals.  Denied SI.  Experienced headache in past 24 hours, worst pain 8, zero pain goal.  Plans to eat better, no drinking.  Wants bipolar medication.  Does have discharge plans.  No problems taking meds after discharge.   A:  Medications administered per MD orders.  Emotional support and encouragement given patient. R:  Denied SI and HI.  Denied A/V hallucinations.  Will continue to monitor patient for safety with 15 minute checks.  Safety maintained.
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
## 2013-12-27 NOTE — Progress Notes (Signed)
Pt did not attend group when asked to by writer she stated that she had a headache.

## 2013-12-27 NOTE — [facility] Group Notes (Signed)
[facility] LCSW Group Therapy  12/27/2013   1:15 PM   Type of Therapy:  Group Therapy  Participation Level:  Active  Participation Quality:  Attentive, Sharing and Supportive  Affect:  Depressed and Flat  Cognitive:  Alert and Oriented  Insight:  Developing/Improving and Engaged  Engagement in Therapy:  Developing/Improving and Engaged  Modes of Intervention:  Clarification, Confrontation, Discussion, Education, Exploration, Limit-setting, Orientation, Problem-solving, Rapport Building, Dance movement psychotherapisteality Testing, Socialization and Support  Summary of Progress/Problems: Pt identified obstacles faced currently and processed barriers involved in overcoming these obstacles. Pt identified steps necessary for overcoming these obstacles and explored motivation (internal and external) for facing these difficulties head on. Pt further identified one area of concern in their lives and chose a goal to focus on for today.  Pt shared that her biggest obstacle right now is getting on the right medication.  Pt states that she continues to have symptoms of depression, anxiety ,SI and mood swings.  CSW encouraged pt to talk with the MD about her symptoms as well.  Pt actively participated and was engaged in group discussion.    Andrea IvanChelsea Horton, LCSW 12/27/2013  3:18 PM

## 2013-12-27 NOTE — Progress Notes (Signed)
[facility] MD Progress Note  12/27/2013 4:37 PM Andrea Oliver  MRN:  [id number]  Subjective:  Met with Andrea Oliver today to discuss her progress and response to treatment. She states she is just here. Denies SI/HI but just existing. After further discussion she states that she wants to continue to use Seroquel xr 225m for her bipolar disorder as she believes that she can afford as long as she meets her deductible being in hospital. She does not want to try any new medication because it worked well for her.   Diagnosis:   DSM5: Schizophrenia Disorders:   Obsessive-Compulsive Disorders:   Trauma-Stressor Disorders:   Substance/Addictive Disorders:   Depressive Disorders:  Bipolar disorder most recent episode manic Total Time spent with patient: 30 minutes    ADL's:  Intact  Sleep: Good  Appetite:  Good  Suicidal Ideation:  denies Homicidal Ideation:  denies AEB (as evidenced by):  Psychiatric Specialty Exam: Physical Exam  ROS  Blood pressure 120/80, pulse 87, temperature 97.4 F (36.3 C), temperature source Oral, resp. rate 18, height '5\' 5"'  (1.651 m), weight 100.245 kg (221 lb), last menstrual period 12/25/2013, SpO2 99.00%.Body mass index is 36.78 kg/(m^2).  General Appearance: Casual  Eye Contact::  Fair  Speech:  Normal Rate  Volume:  Normal  Mood:  Anxious, Depressed and Dysphoric  Affect:  Congruent  Thought Process:  Goal Directed  Orientation:  Full (Time, Place, and Person)  Thought Content:  WDL  Suicidal Thoughts:  No  Homicidal Thoughts:  No  Memory:  NA  Judgement:  Poor  Insight:  Shallow  Psychomotor Activity:  Normal  Concentration:  Fair  Recall:  Andrea Oliver Language: Good  Akathisia:  No  Handed:  Right  AIMS (if indicated):     Assets:  Communication Skills Desire for Improvement Financial Resources/Insurance Housing Physical Health  Sleep:      Musculoskeletal: Strength & Muscle Tone: within normal limits Gait & Station:  normal Patient leans: N/A  Current Medications: Current Facility-Administered Medications  Medication Dose Route Frequency Provider Last Rate Last Dose  . acetaminophen (TYLENOL) tablet 650 mg  650 mg Oral Q6H PRN [name]   650 mg at 12/27/13 1418  . alum & mag hydroxide-simeth (MAALOX/MYLANTA) 200-200-20 MG/5ML suspension 30 mL  30 mL Oral Q4H PRN [name]      . FLUoxetine (PROZAC) capsule 20 mg  20 mg Oral Daily [name]   20 mg at 12/27/13 01157 . hydrOXYzine (ATARAX/VISTARIL) tablet 25 mg  25 mg Oral Q6H PRN [name]   25 mg at 12/26/13 2117  . magnesium hydroxide (MILK OF MAGNESIA) suspension 30 mL  30 mL Oral Daily PRN [name]      . QUEtiapine (SEROQUEL XR) 24 hr tablet 200 mg  200 mg Oral QHS [name]      . traZODone (DESYREL) tablet 50 mg  50 mg Oral QHS PRN [name]        Lab Results:  Results for orders placed during the hospital encounter of 12/25/13 (from the past 48 hour(s))  ACETAMINOPHEN LEVEL     Status: None   Collection Time    12/26/13 12:06 AM      Result Value Ref Range   Acetaminophen (Tylenol), Serum <15.0  10 - 30 ug/mL   Comment:            THERAPEUTIC CONCENTRATIONS  VARY     SIGNIFICANTLY. A RANGE OF 10-30     ug/mL MAY BE AN EFFECTIVE     CONCENTRATION FOR MANY PATIENTS.     HOWEVER, SOME ARE BEST TREATED     AT CONCENTRATIONS OUTSIDE THIS     RANGE.     ACETAMINOPHEN CONCENTRATIONS     >150 ug/mL AT 4 HOURS AFTER     INGESTION AND >50 ug/mL AT 12     HOURS AFTER INGESTION ARE     OFTEN ASSOCIATED WITH TOXIC     REACTIONS.  SALICYLATE LEVEL     Status: Abnormal   Collection Time    12/26/13 12:06 AM      Result Value Ref Range   Salicylate Lvl <5.6 (*) 2.8 - 20.0 mg/dL  CBC     Status: Abnormal   Collection Time    12/26/13 12:06 AM      Result Value Ref Range   WBC 7.4  4.0 - 10.5 K/uL   RBC 4.67  3.87 - 5.11 MIL/uL   Hemoglobin 11.4 (*) 12.0 - 15.0 g/dL    HCT 35.4 (*) 36.0 - 46.0 %   MCV 75.8 (*) 78.0 - 100.0 fL   MCH 24.4 (*) 26.0 - 34.0 pg   MCHC 32.2  30.0 - 36.0 g/dL   RDW 14.2  11.5 - 15.5 %   Platelets 295  150 - 400 K/uL  COMPREHENSIVE METABOLIC PANEL     Status: None   Collection Time    12/26/13 12:06 AM      Result Value Ref Range   Sodium 141  137 - 147 mEq/L   Potassium 3.8  3.7 - 5.3 mEq/L   Chloride 104  96 - 112 mEq/L   CO2 22  19 - 32 mEq/L   Glucose, Bld 94  70 - 99 mg/dL   BUN 7  6 - 23 mg/dL   Creatinine, Ser 0.77  0.50 - 1.10 mg/dL   Calcium 9.1  8.4 - 10.5 mg/dL   Total Protein 7.3  6.0 - 8.3 g/dL   Albumin 3.8  3.5 - 5.2 g/dL   AST 16  0 - 37 U/L   ALT 8  0 - 35 U/L   Alkaline Phosphatase 91  39 - 117 U/L   Total Bilirubin 0.3  0.3 - 1.2 mg/dL   GFR calc non Af Amer >90  >90 mL/min   GFR calc Af Amer >90  >90 mL/min   Comment: (NOTE)     The eGFR has been calculated using the CKD EPI equation.     This calculation has not been validated in all clinical situations.     eGFR's persistently <90 mL/min signify possible Chronic Kidney     Disease.  ETHANOL     Status: Abnormal   Collection Time    12/26/13 12:06 AM      Result Value Ref Range   Alcohol, Ethyl (B) 122 (*) 0 - 11 mg/dL   Comment:            LOWEST DETECTABLE LIMIT FOR     SERUM ALCOHOL IS 11 mg/dL     FOR MEDICAL PURPOSES ONLY  URINE RAPID DRUG SCREEN (HOSP PERFORMED)     Status: Abnormal   Collection Time    12/26/13  2:38 AM      Result Value Ref Range   Opiates NONE DETECTED  NONE DETECTED   Cocaine NONE DETECTED  NONE DETECTED   Benzodiazepines POSITIVE (*)  NONE DETECTED   Amphetamines NONE DETECTED  NONE DETECTED   Tetrahydrocannabinol NONE DETECTED  NONE DETECTED   Barbiturates NONE DETECTED  NONE DETECTED   Comment:            DRUG SCREEN FOR MEDICAL PURPOSES     ONLY.  IF CONFIRMATION IS NEEDED     FOR ANY PURPOSE, NOTIFY LAB     WITHIN 5 DAYS.                LOWEST DETECTABLE LIMITS     FOR URINE DRUG SCREEN     Drug  Class       Cutoff (ng/mL)     Amphetamine      1000     Barbiturate      200     Benzodiazepine   579     Tricyclics       728     Opiates          300     Cocaine          300     THC              50  POC URINE PREG, ED     Status: None   Collection Time    12/26/13  2:42 AM      Result Value Ref Range   Preg Test, Ur NEGATIVE  NEGATIVE   Comment:            THE SENSITIVITY OF THIS     METHODOLOGY IS >24 mIU/mL    Physical Findings: AIMS: Facial and Oral Movements Muscles of Facial Expression: None, normal Lips and Perioral Area: None, normal Jaw: None, normal Tongue: None, normal,Extremity Movements Upper (arms, wrists, hands, fingers): None, normal Lower (legs, knees, ankles, toes): None, normal, Trunk Movements Neck, shoulders, hips: None, normal, Overall Severity Severity of abnormal movements (highest score from questions above): None, normal Incapacitation due to abnormal movements: None, normal Patient's awareness of abnormal movements (rate only patient's report): No Awareness, Dental Status Current problems with teeth and/or dentures?: No Does patient usually wear dentures?: No  CIWA:  CIWA-Ar Total: 1 COWS:  COWS Total Score: 2  Treatment Plan Summary: Daily contact with patient to assess and evaluate symptoms and progress in treatment Medication management  Plan: 1. Continue crisis management and stabilization. 2. Medication management to reduce current symptoms to base line and improve the patient's overall level of functioning. 3. Treat health problems as indicated. 4. Develop treatment plan to decrease risk of relapse upon discharge and to reduce the need for readmission. 5. Psycho-social education regarding relapse prevention and self care. 6. Health care follow up as needed for medical problems. 7.  Dr. Louretta Shorten has restarted Seroquel XR289m at [facility].   Medical Decision Making Problem Points:  Established problem, stable/improving (1) Data  Points:  Review or order medicine tests (1)  I certify that inpatient services furnished can reasonably be expected to improve the patient's condition.   NMarlane Hatcher Mashburn RPAC 4:47 PM 12/27/2013  Patient was seen face to face and case discussed with physician extender. Reviewed the information documented and agree with the treatment plan.  Jadore Veals,JANARDHAHA R. 12/27/2013 6:37 PM

## 2013-12-27 NOTE — Tx Team (Signed)
Interdisciplinary Treatment Plan Update (Adult)  Date: 12/27/2013  Time Reviewed:  9:45 AM  Progress in Treatment: Attending groups: Yes Participating in groups:  Yes Taking medication as prescribed:  Yes Tolerating medication:  Yes Family/Significant othe contact made: CSW assessing  Patient understands diagnosis:  Yes Discussing patient identified problems/goals with staff:  Yes Medical problems stabilized or resolved:  Yes Denies suicidal/homicidal ideation: Yes Issues/concerns per patient self-inventory:  Yes Other:  New problem(s) identified: N/A  Discharge Plan or Barriers: CSW assessing for appropriate referrals.  Reason for Continuation of Hospitalization: Anxiety Depression Medication Stabilization  Comments: N/A  Estimated length of stay: 3-5 days  For review of initial/current patient goals, please see plan of care.  Attendees: Patient:    Family:     Physician:  Dr. Johnalagadda 12/27/2013 10:17 AM   Nursing:   Noelle Ardley, RN 12/27/2013 10:17 AM   Clinical Social Worker:  Tamie Minteer Horton, LCSW 12/27/2013 10:17 AM   Other: Neil Mashburn, PA 12/27/2013 10:17 AM   Other:  Beverly Knight, RN 12/27/2013 10:17 AM   Other:  Carol Davis, RN 12/27/2013 10:17 AM   Other:  Issac, PA studen 12/27/2013 10:18 AM   Other:    Other:    Other:    Other:    Other:      Scribe for Treatment Team:   Horton, Mikayah Joy Nicole, 12/27/2013 , 10:17 AM    
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
## 2013-12-28 NOTE — Progress Notes (Signed)
D:  Patient's self inventory sheet, patient sleeps well, improving appetite, low energy level, poor attention span.  Rated depression 2, hopeless 1, denied anxiety.  Denied withdrawals.  Denied SI.  Has experienced headache in past 24 hours.  Worst pain #1.  Plans to keep taking medications and focus on family after discharge.  Does have discharge plans.  No problems taking medications after discharge. A:  Medications administered per MD orders.  Emotional support and encouragement given patient. R:  Denied SI and HI.  Denied A/V hallucinations.  Denied pain.  Will continue to monitor patient for safety with 15 minute checks.  Safety maintained.

## 2013-12-28 NOTE — Progress Notes (Signed)
The focus of this group is to educate the patient on the purpose and policies of crisis stabilization and provide a format to answer questions about their admission.  The group details unit policies and expectations of patients while admitted.  Patient attended 0900 nurse education orientation group this morning.  Patient actively participated, appropriate affect, alert, appropriate insight and engagement.  Today patient will work on 3 goals for discharge.  

## 2013-12-28 NOTE — [facility] Group Notes (Signed)
[facility] LCSW Group Therapy      Feelings About Diagnosis 1:15 - 2:30 PM         12/28/2013  3:15 PM    Type of Therapy:  Group Therapy  Participation Level:  Active  Participation Quality:  Appropriate  Affect:  Appropriate  Cognitive:  Alert and Appropriate  Insight:  Developing/Improving and Engaged  Engagement in Therapy:  Developing/Improving and Engaged  Modes of Intervention:  Discussion, Education, Exploration, Problem-Solving, Rapport Building, Support  Summary of Progress/Problems:  Patient actively participated in group. Patient discussed past and present diagnosis and the effects it has had on  life.  Patient talked about family and society being judgmental and the stigma associated with having a mental health diagnosis.  Patient shared she is okay with there diagnosis and knows who she is.  Patient described herself as smart and able to accomplish whatever she sets her mind to.  Patient stated she plans to defeat this illness.  She stated she knows she cannot get rid of it but can be the best person possible with MI.  Wynn BankerHodnett, Ioan Landini Hairston 12/28/2013  3:15 PM

## 2013-12-28 NOTE — Progress Notes (Signed)
Patient ID: Andrea Oliver, female   DOB: 11-Oct-1975, 38 y.o.   MRN: [id number] [facility] MD Progress Note  12/28/2013 1:15 PM Andrea Oliver  MRN:  [id number]  Subjective:  Patient complains feeling groggy this morning because she was taken Seroquel 200 mg last night. Patient has been compliant with her medication and has not reported extrapyramidal symptoms. Patient is willing to get ready to start her day with multiple activities planned on the unit. Patient reported she continued to have symptoms of depression and anxiety but with lesser degree. She rates her depression is 2-3/10 and anxiety 1/10. Patient denies suicidal or homicidal ideation.  After further discussion she states that she wants to continue to use Seroquel xr 200mg  for her bipolar disorder as she believes that she can afford as long as she meets her deductible being in hospital. She does not want to try any new medication because it worked well for her.   Diagnosis:   DSM5: Schizophrenia Disorders:   Obsessive-Compulsive Disorders:   Trauma-Stressor Disorders:   Substance/Addictive Disorders:   Depressive Disorders:  Bipolar disorder most recent episode manic Total Time spent with patient: 30 minutes    ADL's:  Intact  Sleep: Good  Appetite:  Good  Suicidal Ideation:  denies Homicidal Ideation:  denies AEB (as evidenced by):  Psychiatric Specialty Exam: Physical Exam  ROS  Blood pressure 110/66, pulse 100, temperature 97.8 F (36.6 C), temperature source Oral, resp. rate 18, height 5\' 5"  (1.651 m), weight 100.245 kg (221 lb), last menstrual period 12/25/2013, SpO2 99.00%.Body mass index is 36.78 kg/(m^2).  General Appearance: Casual  Eye Contact::  Fair  Speech:  Normal Rate  Volume:  Normal  Mood:  Anxious, Depressed and Dysphoric  Affect:  Congruent  Thought Process:  Goal Directed  Orientation:  Full (Time, Place, and Person)  Thought Content:  WDL  Suicidal Thoughts:  No  Homicidal Thoughts:  No  Memory:   NA  Judgement:  Poor  Insight:  Shallow  Psychomotor Activity:  Normal  Concentration:  Fair  Recall:  Fair  Fund of Knowledge:Fair  Language: Good  Akathisia:  No  Handed:  Right  AIMS (if indicated):     Assets:  Communication Skills Desire for Improvement Financial Resources/Insurance Housing Physical Health  Sleep:  Number of Hours: 5.25   Musculoskeletal: Strength & Muscle Tone: within normal limits Gait & Station: normal Patient leans: N/A  Current Medications: Current Facility-Administered Medications  Medication Dose Route Frequency Provider Last Rate Last Dose  . acetaminophen (TYLENOL) tablet 650 mg  650 mg Oral Q6H PRN Kristeen Mans, NP   650 mg at 12/27/13 1418  . alum & mag hydroxide-simeth (MAALOX/MYLANTA) 200-200-20 MG/5ML suspension 30 mL  30 mL Oral Q4H PRN Kristeen Mans, NP      . FLUoxetine (PROZAC) capsule 20 mg  20 mg Oral Daily Beau Fanny, FNP   20 mg at 12/27/13 7829  . hydrOXYzine (ATARAX/VISTARIL) tablet 25 mg  25 mg Oral Q6H PRN Beau Fanny, FNP   25 mg at 12/26/13 2117  . magnesium hydroxide (MILK OF MAGNESIA) suspension 30 mL  30 mL Oral Daily PRN Kristeen Mans, NP      . QUEtiapine (SEROQUEL XR) 24 hr tablet 200 mg  200 mg Oral QHS Nehemiah Settle, MD   200 mg at 12/27/13 2148  . traZODone (DESYREL) tablet 50 mg  50 mg Oral QHS PRN Beau Fanny, FNP   50 mg at 12/27/13  2149    Lab Results:  No results found for this or any previous visit (from the past 48 hour(s)).  Physical Findings: AIMS: Facial and Oral Movements Muscles of Facial Expression: None, normal Lips and Perioral Area: None, normal Jaw: None, normal Tongue: None, normal,Extremity Movements Upper (arms, wrists, hands, fingers): None, normal Lower (legs, knees, ankles, toes): None, normal, Trunk Movements Neck, shoulders, hips: None, normal, Overall Severity Severity of abnormal movements (highest score from questions above): None, normal Incapacitation due to  abnormal movements: None, normal Patient's awareness of abnormal movements (rate only patient's report): No Awareness, Dental Status Current problems with teeth and/or dentures?: No Does patient usually wear dentures?: No  CIWA:  CIWA-Ar Total: 2 COWS:  COWS Total Score: 2  Treatment Plan Summary: Daily contact with patient to assess and evaluate symptoms and progress in treatment Medication management  Plan: 1. Continue crisis management and stabilization. 2. Medication management to reduce current symptoms to base line and improve the patient's overall level of functioning. Continue Seroquel XR 200 mg Qhs while she is being adjusting for it Continue Fluoxetine 20 mg QD Continue Trazodone 50 mg Qhs PRN 3. Treat health problems as indicated. 4. Develop treatment plan to decrease risk of relapse upon discharge and to reduce the need for readmission. 5. Psycho-social education regarding relapse prevention and self care. 6. Health care follow up as needed for medical problems. 7. Disposition plans are in progress and this will be discussed at that morning during treatment team meeting    Medical Decision Making Problem Points:  Established problem, stable/improving (1) Data Points:  Review or order medicine tests (1)  I certify that inpatient services furnished can reasonably be expected to improve the patient's condition.    Keyly Baldonado,JANARDHAHA R. 12/28/2013 1:15 PM

## 2013-12-28 NOTE — Progress Notes (Signed)
Recreation Therapy Notes  Animal-Assisted Activity/Therapy (AAA/T) Program Checklist/Progress Notes Patient Eligibility Criteria Checklist & Daily Group note for Rec Tx Intervention  Date: 04.07.2015 Time: 2:45pm Location: [address] Dayroom   AAA/T Program Assumption of Risk Form signed by Patient/ or Parent Legal Guardian yes  Patient is free of allergies or sever asthma yes  Patient reports no fear of animals yes  Patient reports no history of cruelty to animals yes   Patient understands his/her participation is voluntary yes  Patient washes hands before animal contact yes  Patient washes hands after animal contact yes  Behavioral Response: Engaged, Appropriate   Education: Hand Washing, Appropriate Animal Interaction   Education Outcome: Acknowledges understanding  Clinical Observations/Feedback: Patient pet therapy dog and interacted with peers appropriately.   Andrea Oliver L Shantay Sonn, LRT/CTRS        Evelean Bigler L 12/28/2013 4:52 PM 

## 2013-12-28 NOTE — Progress Notes (Signed)
Pt did not attend wrap up group this evening.  

## 2013-12-29 DIAGNOSIS — F3110 Bipolar disorder, current episode manic without psychotic features, unspecified: Principal | ICD-10-CM

## 2013-12-29 DIAGNOSIS — F329 Major depressive disorder, single episode, unspecified: Secondary | ICD-10-CM

## 2013-12-29 NOTE — Progress Notes (Signed)
Patient ID: Andrea LimHeather J Polyak, female   DOB: March 05, 1976, 38 y.o.   MRN: [id number] Patient ID: Andrea LimHeather J Bunting, female   DOB: March 05, 1976, 38 y.o.   [id number] [facility]  12/29/2013 5:57 PM Andrea LimHeather J Lingelbach  MRN:  [id number]  Subjective:  [name] is in her room today.  She voices no new complaints and states she is anxious to go home. She says her anxiety is a 2-3/10, and her depression is a 0/10. She does want to know if she should continue taking the prozac, she refused it this morning. She feels that the Seroquel is a good choice for her and will get her to where she needs to be.  Diagnosis:   DSM5: Schizophrenia Disorders:   Obsessive-Compulsive Disorders:   Trauma-Stressor Disorders:   Substance/Addictive Disorders:   Depressive Disorders:  Bipolar disorder most recent episode manic Total Time spent with patient: 30 minutes    ADL's:  Intact  Sleep: Good  Appetite:  Good  Suicidal Ideation:  denies Homicidal Ideation:  denies AEB (as evidenced by):  Psychiatric Specialty Exam: Physical Exam  ROS  Blood pressure 90/60, pulse 90, temperature 97.7 F (36.5 C), temperature source Oral, resp. rate 18, height 5\' 5"  (1.651 m), weight 100.245 kg (221 lb), last menstrual period 12/25/2013, SpO2 99.00%.Body mass index is 36.78 kg/(m^2).  General Appearance: Casual  Eye Contact::  Fair  Speech:  Normal Rate  Volume:  Normal  Mood:  Anxious, Depressed and Dysphoric  Affect:  Congruent  Thought Process:  Goal Directed  Orientation:  Full (Time, Place, and Person)  Thought Content:  WDL  Suicidal Thoughts:  No  Homicidal Thoughts:  No  Memory:  NA  Judgement:  Poor  Insight:  Shallow  Psychomotor Activity:  Normal  Concentration:  Fair  Recall:  Fair  Fund of Knowledge:Fair  Language: Good  Akathisia:  No  Handed:  Right  AIMS (if indicated):     Assets:  Communication Skills Desire for Improvement Financial Resources/Insurance Housing Physical Health  Sleep:   Number of Hours: 6.5   Musculoskeletal: Strength & Muscle Tone: within normal limits Gait & Station: normal Patient leans: N/A  Current Medications: Current Facility-Administered Medications  Medication Dose Route Frequency Provider Last Rate Last Dose  . acetaminophen (TYLENOL) tablet 650 mg  650 mg Oral Q6H PRN Kristeen MansFran E Hobson, NP   650 mg at 12/27/13 1418  . alum & mag hydroxide-simeth (MAALOX/MYLANTA) 200-200-20 MG/5ML suspension 30 mL  30 mL Oral Q4H PRN Kristeen MansFran E Hobson, NP      . FLUoxetine (PROZAC) capsule 20 mg  20 mg Oral Daily Beau FannyJohn C Withrow, FNP   20 mg at 12/29/13 [id number]  . hydrOXYzine (ATARAX/VISTARIL) tablet 25 mg  25 mg Oral Q6H PRN Beau FannyJohn C Withrow, FNP   25 mg at 12/26/13 2117  . magnesium hydroxide (MILK OF MAGNESIA) suspension 30 mL  30 mL Oral Daily PRN Kristeen MansFran E Hobson, NP      . QUEtiapine (SEROQUEL XR) 24 hr tablet 200 mg  200 mg Oral QHS Nehemiah SettleJanardhaha R Shamere Dilworth, MD   200 mg at 12/28/13 2136  . traZODone (DESYREL) tablet 50 mg  50 mg Oral QHS PRN Beau FannyJohn C Withrow, FNP   50 mg at 12/28/13 2135    Lab Results:  No results found for this or any previous visit (from the past 48 hour(s)).  Physical Findings: AIMS: Facial and Oral Movements Muscles of Facial Expression: None, normal Lips and Perioral Area: None, normal  Jaw: None, normal Tongue: None, normal,Extremity Movements Upper (arms, wrists, hands, fingers): None, normal Lower (legs, knees, ankles, toes): None, normal, Trunk Movements Neck, shoulders, hips: None, normal, Overall Severity Severity of abnormal movements (highest score from questions above): None, normal Incapacitation due to abnormal movements: None, normal Patient's awareness of abnormal movements (rate only patient's report): No Awareness, Dental Status Current problems with teeth and/or dentures?: No Does patient usually wear dentures?: No  CIWA:  CIWA-Ar Total: 0 COWS:  COWS Total Score: 1  Treatment Plan Summary: Daily contact with patient to  assess and evaluate symptoms and progress in treatment Medication management  Plan: 1. Continue crisis management and stabilization. 2. Medication management to reduce current symptoms to base line and improve the patient's overall level of functioning. Continue Seroquel XR 200 mg Qhs while she is being adjusting for it Continue Fluoxetine 20 mg QD Continue Trazodone 50 mg Qhs PRN 3. Treat health problems as indicated. 4. Develop treatment plan to decrease risk of relapse upon discharge and to reduce the need for readmission. 5. Psycho-social education regarding relapse prevention and self care. 6. Health care follow up as needed for medical problems. 7. Disposition plans are in progress and this will be discussed at that morning during treatment team meeting  8. Anticipate D/C out in the AM. She hopes to follow up with [facility] downstairs as she is not familiar with [place].  Medical Decision Making Problem Points:  Established problem, stable/improving (1) Data Points:  Review or order medicine tests (1)  I certify that inpatient services furnished can reasonably be expected to improve the patient's condition.   Rona Ravens. Mashburn RPAC 6:01 PM 12/29/2013  Reviewed the information documented and agree with the treatment plan.  Randal Buba Axel Frisk 12/30/2013 12:12 PM

## 2013-12-29 NOTE — Progress Notes (Signed)
D: Patient resting in bed with eyes closed.  Respirations even and unlabored.  Patient appears to be in no apparent distress. A: Staff to monitor Q 15 mins for safety.   R:Patient remains safe on the unit.  

## 2013-12-29 NOTE — [facility] Group Notes (Signed)
[facility]   12/29/2013 9:44 AM    Participation Quality:  Appropraite  Mood/Affect:  Appropriate  Depression Rating:  1  Anxiety Rating:  1  Thoughts of Suicide:  No  Will you contract for safety?   NA  Current AVH:  No  Plan for Discharge/Comments:  Patient attended discharge planning group and actively participated in group. She reports doing well and hopes to discharge soon.  [facility].  CSW provided all participants with daily workbook.   Transportation Means: Patient has transportation.   Supports:  Patient has a support system.   Danyon Mcginness, Joesph JulyQuylle Hairston

## 2013-12-29 NOTE — Progress Notes (Signed)
Adult Psychoeducational Group Note  Date:  12/29/2013 Time:  2:18 PM  Group Topic/Focus:  Stages of Change:   The focus of this group is to explain the stages of change and help patients identify changes they want to make upon discharge.  Participation Level:  Active  Participation Quality:  Appropriate, Attentive, Sharing and Supportive  Affect:  Appropriate and Depressed  Cognitive:  Alert, Appropriate and Oriented  Insight: Appropriate and Good  Engagement in Group:  Engaged and Supportive  Modes of Intervention:  Discussion, Education, Socialization and Support  Additional Comments:  Pt attended and participated in group.    Berlin HunDavid M Lyfe Reihl 12/29/2013, 2:18 PM

## 2013-12-29 NOTE — Progress Notes (Signed)
D: Patient presents with appropriate affect and mood. She reported on the self inventory sheet that she's sleeping well, appetite and ability to pay attention are both good and energy level is low. Patient rates depression and feelings of hopelessness "1". She's attending and actively participating in groups. Patient adheres to medication regimen.  A: Support and encouragement provided to patient. Administered scheduled medications per ordering MD. Monitor Q15 minute checks for safety.  R: Patient receptive. Denies SI/HI/AVH. Patient remains safe on the unit.

## 2013-12-29 NOTE — Tx Team (Signed)
Interdisciplinary Treatment Plan Update   Date Reviewed:  12/29/2013  Time Reviewed:  8:40 AM  Progress in Treatment:   Attending groups: Yes Participating in groups: Yes Taking medication as prescribed: Yes  Tolerating medication: Yes Family/Significant other contact made:  Yes, collateral contact with mother Patient understands diagnosis: Yes  Discussing patient identified problems/goals with staff: Yes Medical problems stabilized or resolved: Yes Denies suicidal/homicidal ideation: Yes Patient has not harmed self or others: Yes  For review of initial/current patient goals, please see plan of care.  Estimated Length of Stay:  1 - 2 days  Reasons for Continued Hospitalization:  Anxiety Depression Medication stabilization  New Problems/Goals identified:    Discharge Plan or Barriers:   [facility]  Additional Comments:  Attendees:  Patient:  12/29/2013 8:40 AM   Signature: Mervyn GayJ. Jonnalagadda, MD 12/29/2013 8:40 AM  Signature:  Verne SpurrNeil Mashburn, PA 12/29/2013 8:40 AM  Signature:   12/29/2013 8:40 AM  Signature: 12/29/2013 8:40 AM  Signature:  12/29/2013 8:40 AM  Signature:  Juline PatchQuylle Kimo Bancroft, LCSW 12/29/2013 8:40 AM  Signature:  Reyes Ivanhelsea Horton, LCSW 12/29/2013 8:40 AM  Signature:  Leisa LenzValerie Enoch, Care Coordinator [facility] 12/29/2013 8:40 AM  Signature:  Aloha GellKrista Dopson, RN 12/29/2013 8:40 AM  Signature: 12/29/2013  8:40 AM  Signature:   Onnie BoerJennifer Clark, RN [facility] 12/29/2013  8:40 AM  Signature:  Harold Barbanonecia Byrd, RN 12/29/2013  8:40 AM    Scribe for Treatment Team:   Juline PatchQuylle Juneau Doughman,  12/29/2013 8:40 AM

## 2013-12-29 NOTE — [facility] Group Notes (Signed)
[facility] LCSW Group Therapy  Emotional Regulation 1:15 - 2: 30 PM        12/29/2013     Type of Therapy:  Group Therapy  Participation Level:  Appropriate  Participation Quality:  Appropriate  Affect:  Appropriate  Cognitive:  Attentive Appropriate  Insight:  Developing/Improving Engaged  Engagement in Therapy:  Developing/Improving Engaged  Modes of Intervention:  Discussion Exploration Problem-Solving Supportive  Summary of Progress/Problems:  Group topic was emotional regulations.  Patient participated in the discussion and was able to identify an emotion that needed to regulated.  Patient shared the emotion she has to control is anger.  She shared her ex-husband often wishes she would die a slow and horrible death.  She shared her brother wished the same for his wife and she died in a car accident.  Patient was able to identify approprite coping skills.  Wynn BankerHodnett, Gabrella Stroh Hairston 12/29/2013

## 2013-12-29 NOTE — [facility] Suicide Risk Assessment (Signed)
[facility] INPATIENT:  Family/Significant Other Suicide Prevention Education  Late Entry for December 28, 2013  Suicide Prevention Education:  Education Completed; Andrea Oliver, Mother - [phone]; has been identified by the patient as the family member/significant other with whom the patient will be residing, and identified as the person(s) who will aid the patient in the event of a mental health crisis (suicidal ideations/suicide attempt).  With written consent from the patient, the family member/significant other has been provided the following suicide prevention education, prior to the and/or following the discharge of the patient.  The suicide prevention education provided includes the following:  Suicide risk factors  Suicide prevention and interventions  National Suicide Hotline telephone number  [facility] assessment telephone number  [facility] Emergency Assistance 911  [facility] and/or Residential Mobile Crisis Unit telephone number  Request made of family/significant other to:  Remove weapons (e.g., guns, rifles, knives), all items previously/currently identified as safety concern.  Mother advised patient does not have access to guns.  Remove drugs/medications (over-the-counter, prescriptions, illicit drugs), all items previously/currently identified as a safety concern.  The family member/significant other verbalizes understanding of the suicide prevention education information provided.  The family member/significant other agrees to remove the items of safety concern listed above.  Andrea Oliver Andrea Oliver 12/29/2013, 9:41 AM

## 2013-12-29 NOTE — Progress Notes (Signed)
Adult Psychoeducational Group Note  Date:  12/29/2013 Time:  9:09 PM  Group Topic/Focus:  Wrap-Up Group:   The focus of this group is to help patients review their daily goal of treatment and discuss progress on daily workbooks.  Participation Level:  Active  Participation Quality:  Appropriate  Affect:  Appropriate  Cognitive:  Appropriate  Insight: Appropriate  Engagement in Group:  Engaged  Modes of Intervention:  Support  Additional Comments:  Pt stated that positive thing that happened was that she was able to get a visit from her mother and that since her social worker talked to her mother, her mom has been more understanding and patient about her mental illness she just hopes that her husband is the same way. Goal fro tomorrow is to start a journal for her daughter one for positive occurences and a private one for when she has a negative occurrence in her life   Andrea Oliver 12/29/2013, 9:09 PM

## 2013-12-29 NOTE — Progress Notes (Signed)
Patient ID: Andrea LimHeather J Oliver, female   DOB: [birth year], 38 y.o.   MRN: [id number] D: pt. In room visiting with mother, reports "I'm ready to go home, I'm taking my medications, I'm feeling better and I have a positive outlook." Pt. Reports she has benefited from this admission, "compared to what a lot of people going through, I don't have no problems." Pt. Denies SHI. Pt. Notes that she has gained some coping skills and plans to deal with getting upset by writing the bad things she wants to says in one journal and the good things in another journal to reflect on, so her daughter one day can read and see that she wasn't always a bad person. Pt. Reports discharge plans as looking into IOP. Mom reports "I see a big difference in her than when she came" Mom says "I had a talk with Horace PorteousQuylle and that helped me see a lot of what she was going through" A: Clinical research associateWriter commended client on plans to move towards mental wellness, encouraged IOP. Staff will monitor q3115min for safety. R: pt. Is safe on the unit and attends group.

## 2013-12-30 MED ORDER — TRAZODONE HCL 50 MG PO TABS
50.0000 mg | ORAL_TABLET | Freq: Every evening | ORAL | Status: DC | PRN
Start: 2013-12-30 — End: 2014-06-23

## 2013-12-30 MED ORDER — QUETIAPINE FUMARATE ER 200 MG PO TB24: 200.0000 mg | ORAL_TABLET | Freq: Every day | ORAL | Status: DC

## 2013-12-30 MED ORDER — FLUOXETINE HCL 20 MG PO CAPS: 20.0000 mg | ORAL_CAPSULE | Freq: Every day | ORAL | Status: DC

## 2013-12-30 NOTE — Progress Notes (Signed)
[facility] Discharge Plan :  Will you be returning to the same living situation after discharge: Yes,  Patient is returning to her home. At discharge, do you have transportation home?:Yes,  Patient to arrange transporation home. Do you have the ability to pay for your medications:Yes,  Patient is able to obtain medictions.  Release of information consent forms completed and in the chart;  Patient's signature needed at discharge.  Patient to Follow up at: Follow-up Information   [facility] On [facility]2014. (Appointment scheduled at 2:30 pm on this date with Dr. Alena BillsAgrawal for medication management. Please bring completed paperwork to this appointment. )    Contact information:   [address] [place], [place] [address] Phone: [phone]      [facility] On 01/04/2014. (Tuesday, January 04, 2014 at 2:30 PM)    Contact information:   [address] [place], [place]   [address]  [phone]      Patient denies SI/HI: Patient no longer endorsing SI/HI or other thoughts of self harm.   Safety Planning and Suicide Prevention discussed: .Reviewed with all patients during discharge planning group  Novice Vrba Hairston Reagan Klemz 12/30/2013, 12:26 PM
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
## 2013-12-30 NOTE — [facility] Suicide Risk Assessment (Signed)
[facility] INPATIENT:  Family/Significant Other Suicide Prevention Education  Suicide Prevention Education:  Education Completed, Andrea Oliver, Husband [phone]- [phone]; has been identified by the patient as the family member/significant other with whom the patient will be residing, and identified as the person(s) who will aid the patient in the event of a mental health crisis (suicidal ideations/suicide attempt).  With written consent from the patient, the family member/significant other has been provided the following suicide prevention education, prior to the and/or following the discharge of the patient.  The suicide prevention education provided includes the following:  Suicide risk factors  Suicide prevention and interventions  National Suicide Hotline telephone number  [facility] assessment telephone number  [facility] Emergency Assistance 911  [facility] and/or Residential Mobile Crisis Unit telephone number  Request made of family/significant other to:  Remove weapons (e.g., guns, rifles, knives), all items previously/currently identified as safety concern.  Husband advised patient does not have access to guns.   Remove drugs/medications (over-the-counter, prescriptions, illicit drugs), all items previously/currently identified as a safety concern.  The family member/significant other verbalizes understanding of the suicide prevention education information provided.  The family member/significant other agrees to remove the items of safety concern listed above.  Andrea Oliver 12/30/2013, 9:01 AM

## 2013-12-30 NOTE — Progress Notes (Signed)
Discharge Note: Discharge instructions/prescriptions/medication samples given to patient. Patient verbalized understanding of discharge instructions and prescriptions. Returned belongings to patient. Denies SI/HI/AVH. Patient d/c without incident to the lobby and transported home by a relative. 

## 2013-12-30 NOTE — Progress Notes (Signed)
Patient ID: Andrea LimHeather J Oliver, female   DOB: 1976/05/29, 38 y.o.   MRN: [id number] [facility] 0900 AM  The focus of this group is to educate the patient on the purpose and policies of crisis stabilization and provide a format to answer questions about their admission.  The group details unit policies and expectations of patients while admitted.  Patient did not attend group.

## 2013-12-30 NOTE — [facility] Suicide Risk Assessment (Signed)
   Demographic Factors:  Adolescent or young adult, Caucasian and Low socioeconomic status  Total Time spent with patient: 30 minutes  Psychiatric Specialty Exam: Physical Exam  ROS  Blood pressure 90/60, pulse 90, temperature 97.7 F (36.5 C), temperature source Oral, resp. rate 18, height 5\' 5"  (1.651 m), weight 100.245 kg (221 lb), last menstrual period 12/25/2013, SpO2 99.00%.Body mass index is 36.78 kg/(m^2).  General Appearance: Casual and Fairly Groomed  Patent attorneyye Contact::  Good  Speech:  Clear and Coherent  Volume:  Normal  Mood:  Euthymic  Affect:  Appropriate and Congruent  Thought Process:  Coherent and Goal Directed  Orientation:  Full (Time, Place, and Person)  Thought Content:  WDL  Suicidal Thoughts:  No  Homicidal Thoughts:  No  Memory:  Immediate;   Good  Judgement:  Fair  Insight:  Good  Psychomotor Activity:  Normal  Concentration:  Good  Recall:  Good  Fund of Knowledge:Good  Language: Good  Akathisia:  NA  Handed:  Right  AIMS (if indicated):     Assets:  Communication Skills Desire for Improvement Financial Resources/Insurance Housing Intimacy Leisure Time Physical Health Resilience Social Support Talents/Skills Transportation Vocational/Educational  Sleep:  Number of Hours: 6.75    Musculoskeletal: Strength & Muscle Tone: within normal limits Gait & Station: normal Patient leans: N/A   Mental Status Per Nursing Assessment::   On Admission:  Suicidal ideation indicated by patient  Current Mental Status by Physician: NA  Loss Factors: Financial problems/change in socioeconomic status  Historical Factors: Family history of suicide, Family history of mental illness or substance abuse and Impulsivity  Risk Reduction Factors:   Responsible for children under 37 years of age, Sense of responsibility to family, Religious beliefs about death, Employed, Living with another person, especially a relative, Positive social support, Positive  therapeutic relationship and Positive coping skills or problem solving skills  Continued Clinical Symptoms:  Bipolar Disorder:   Mixed State Depression:   Comorbid alcohol abuse/dependence Impulsivity Recent sense of peace/wellbeing Previous Psychiatric Diagnoses and Treatments  Cognitive Features That Contribute To Risk:  Polarized thinking    Suicide Risk:  Minimal: No identifiable suicidal ideation.  Patients presenting with no risk factors but with morbid ruminations; may be classified as minimal risk based on the severity of the depressive symptoms  Discharge Diagnoses:   AXIS I:  Bipolar, Depressed AXIS II:  Deferred AXIS III:   Past Medical History  Diagnosis Date  . Chronic kidney disease   . Anemia   . Asthma   . Hypertension     Ecclampsia  . GERD (gastroesophageal reflux disease)   . Depression   . Allergy   . Anxiety   . Ulcer   . Ulcer    AXIS IV:  economic problems, other psychosocial or environmental problems, problems related to social environment and problems with primary support group AXIS V:  61-70 mild symptoms  Plan Of Care/Follow-up recommendations:  Activity:  As directed Diet:  Regular  Is patient on multiple antipsychotic therapies at discharge:  No   Has Patient had three or more failed trials of antipsychotic monotherapy by history:  No  Recommended Plan for Multiple Antipsychotic Therapies: NA    Andrea Oliver 12/30/2013, 10:33 AM

## 2014-01-04 NOTE — Progress Notes (Signed)
Patient Discharge Instructions:  After Visit Summary (AVS):   Faxed to:  01/04/14 Discharge Summary Note:   Faxed to:  01/04/14 Psychiatric Admission Assessment Note:   Faxed to:  01/04/14 Suicide Risk Assessment - Discharge Assessment:   Faxed to:  01/04/14 Faxed/Sent to the Next Level Care provider:  01/04/14 Next Level Care Provider Has Access to the EMR, 01/04/14 Faxed to [facility] @ [phone] Records provided to [facility] via [facility]/Epic access.  Jerelene ReddenSheena E [place], 01/04/2014, 4:01 PM

## 2014-01-11 ENCOUNTER — Ambulatory Visit (HOSPITAL_COMMUNITY): Payer: Self-pay | Admitting: Psychiatry

## 2014-01-19 ENCOUNTER — Telehealth (HOSPITAL_COMMUNITY): Payer: Self-pay

## 2014-01-19 NOTE — Telephone Encounter (Signed)
This patient is overdue for recommended follow-up with a bariatric surgeon [facility]. Call attempted today to reestablish post-op care with [facility]. Patient advised that she now lives close to [place] and is following up with a bariatric surgeon closer to home. She could not remember the name of the doctor or practice off hand & did not have immediate access to information to share with me for documenting purposes.    Jim LikeAmanda T. [facility] Coordinator [phone]

## 2014-12-15 IMAGING — CR DG LUMBAR SPINE 2-3V
2 series · 2 of 2 positions shown · non-contrast
Comparison: 08/21/2010

CLINICAL DATA: Fall, back pain

LUMBAR SPINE - 2-3 VIEW

[AP]
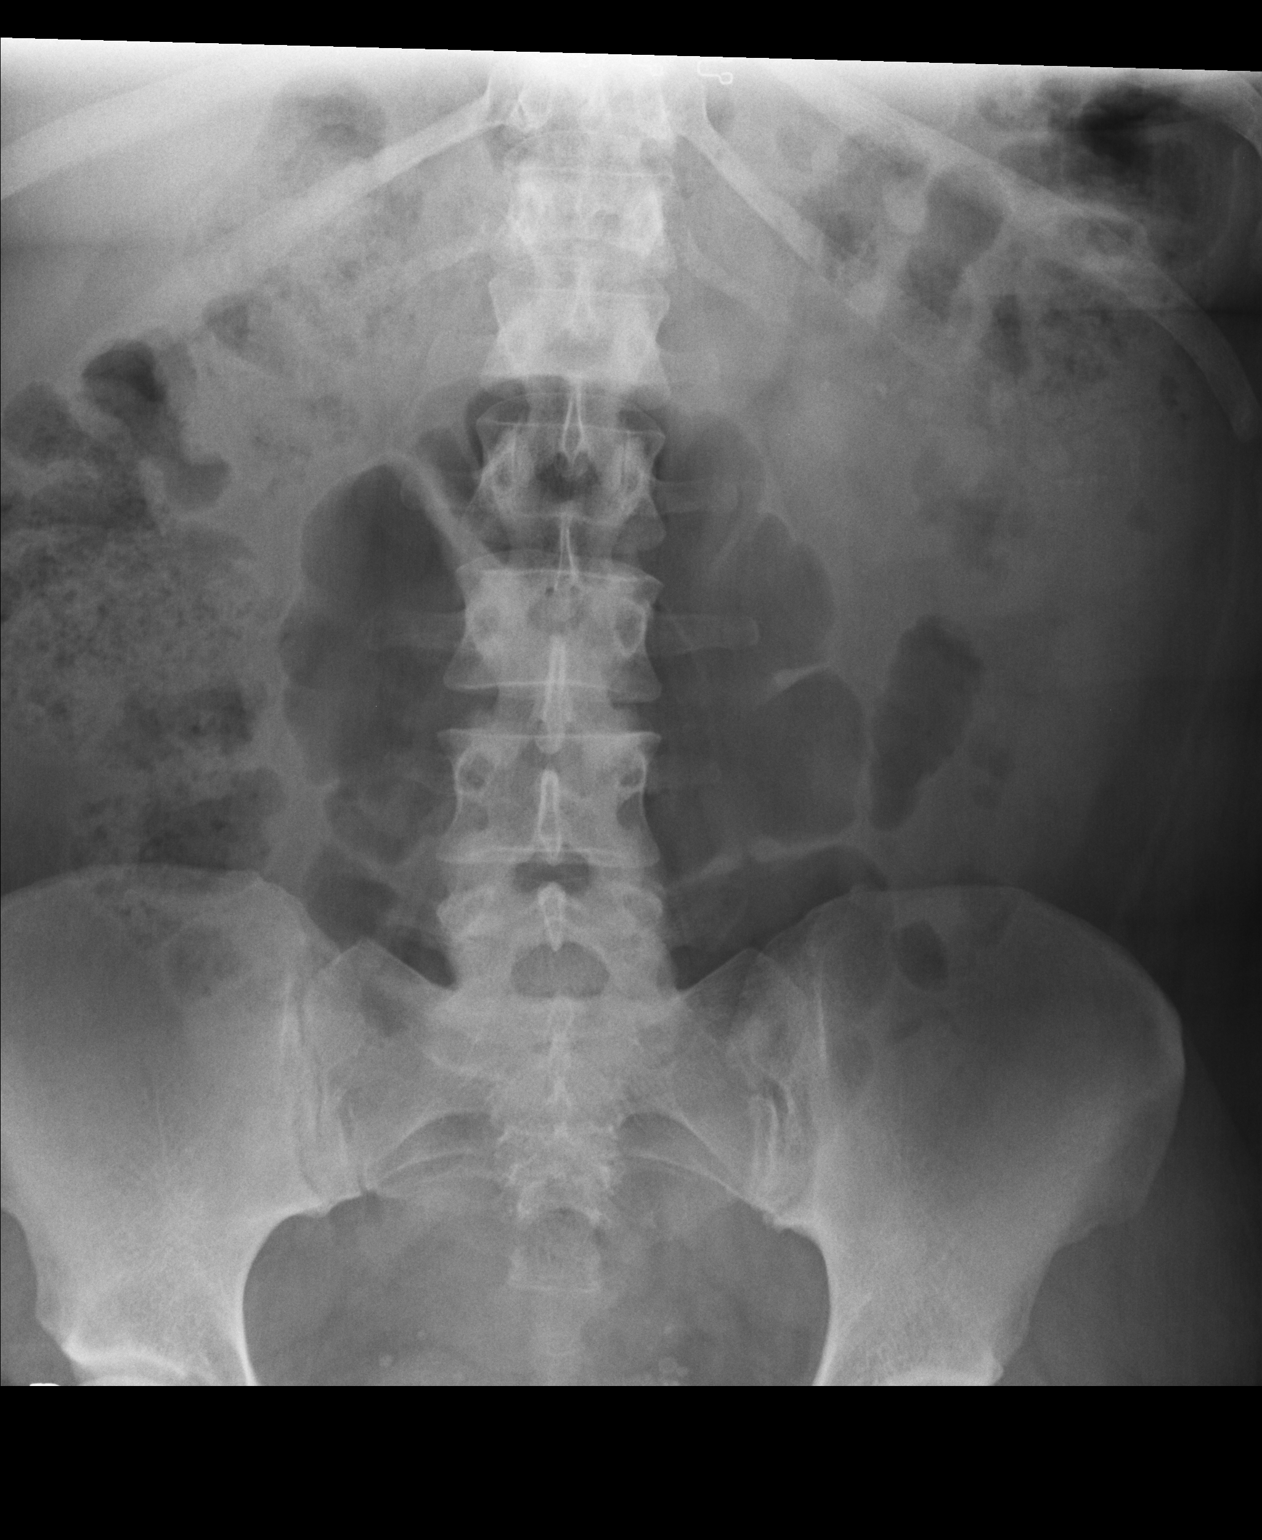

[lateral]
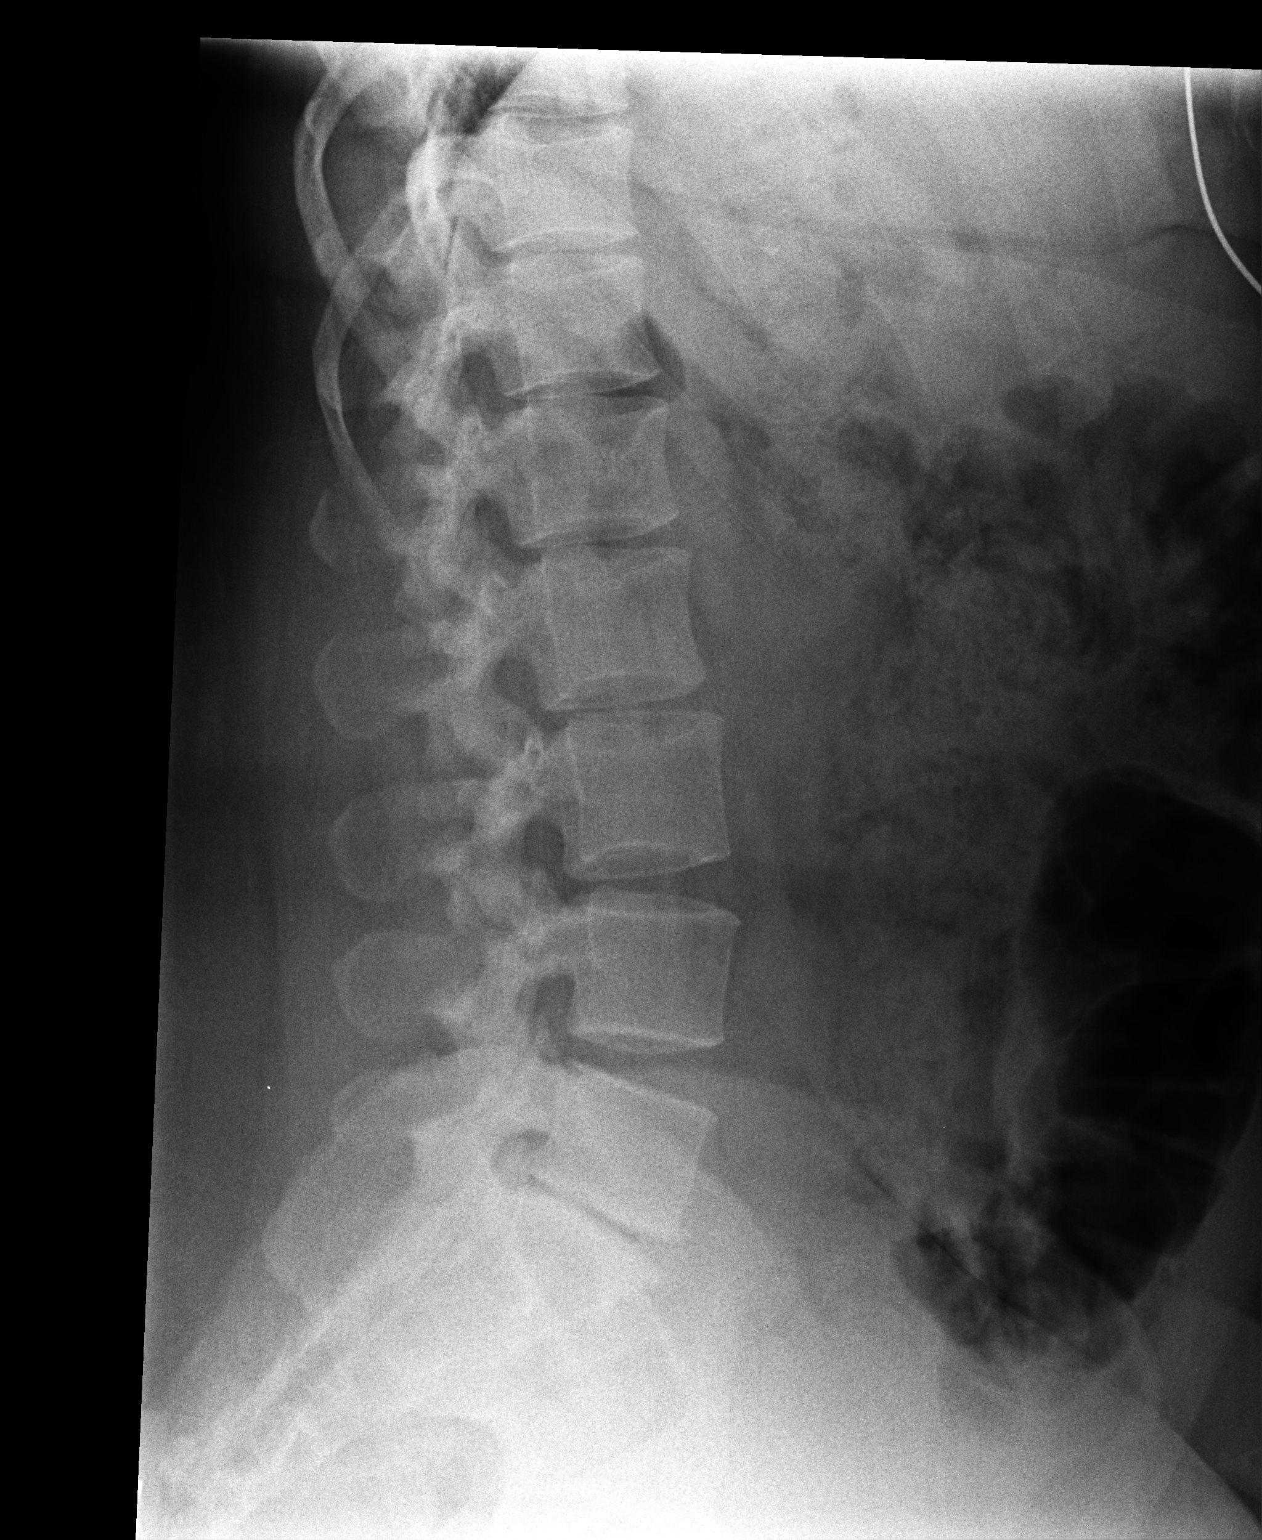

[2 of 2 positions shown; findings below may reference images not displayed]

FINDINGS: Normal alignment without fracture, compression deformity
or focal kyphosis.  L5 S1 degenerative disc disease noted with disc
space narrowing, sclerosis and bony spurring.  No definite pars
defects.  Normal pedicles and SI joints.  Slight gaseous distention
of the sigmoid in the midline.  Venous phleboliths present in the
pelvis.
IMPRESSION: L5 S1 degenerative disc disease.  No acute finding.

Clinically significant discrepancy from primary report, if
provided: None

## 2018-07-08 ENCOUNTER — Encounter: Payer: Self-pay | Admitting: Emergency Medicine

## 2018-07-08 DIAGNOSIS — F411 Generalized anxiety disorder: Secondary | ICD-10-CM | POA: Insufficient documentation

## 2018-07-14 ENCOUNTER — Ambulatory Visit: Payer: Self-pay | Admitting: Psychiatry

## 2018-08-11 ENCOUNTER — Encounter: Payer: Self-pay | Admitting: Psychiatry

## 2018-08-11 ENCOUNTER — Ambulatory Visit: Payer: 59 | Admitting: Psychiatry

## 2018-08-11 VITALS — BP 122/84 | HR 80 | Ht 65.0 in | Wt 235.0 lb

## 2018-08-11 DIAGNOSIS — F902 Attention-deficit hyperactivity disorder, combined type: Secondary | ICD-10-CM | POA: Insufficient documentation

## 2018-08-11 DIAGNOSIS — F411 Generalized anxiety disorder: Secondary | ICD-10-CM | POA: Diagnosis not present

## 2018-08-11 DIAGNOSIS — F5081 Binge eating disorder: Secondary | ICD-10-CM

## 2018-08-11 DIAGNOSIS — F3181 Bipolar II disorder: Secondary | ICD-10-CM

## 2018-08-11 MED ORDER — AMPHETAMINE-DEXTROAMPHET ER 15 MG PO CP24: 15.0000 mg | ORAL_CAPSULE | Freq: Two times a day (BID) | ORAL | 0 refills | Status: DC

## 2018-08-11 MED ORDER — QUETIAPINE FUMARATE ER 200 MG PO TB24: 200.0000 mg | ORAL_TABLET | Freq: Every day | ORAL | 5 refills | Status: DC
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
## 2018-10-20 ENCOUNTER — Telehealth: Payer: Self-pay | Admitting: Psychiatry

## 2018-10-20 DIAGNOSIS — F3181 Bipolar II disorder: Secondary | ICD-10-CM

## 2018-10-20 DIAGNOSIS — F411 Generalized anxiety disorder: Secondary | ICD-10-CM

## 2018-10-20 MED ORDER — CLONAZEPAM 0.5 MG PO TABS: 0.5000 mg | ORAL_TABLET | Freq: Two times a day (BID) | ORAL | 1 refills | Status: DC | PRN

## 2018-10-20 NOTE — Telephone Encounter (Signed)
At last appointment last month, patient takes Klonopin as needed for anxiety and mood swing consequences needing renewal with [facility] 0.5 mg twice daily as needed #180 with 1 refill E scribed to [facility].

## 2018-11-05 ENCOUNTER — Telehealth: Payer: Self-pay | Admitting: Psychiatry

## 2018-11-05 DIAGNOSIS — F902 Attention-deficit hyperactivity disorder, combined type: Secondary | ICD-10-CM

## 2018-11-05 MED ORDER — AMPHETAMINE-DEXTROAMPHET ER 30 MG PO CP24: 30.0000 mg | ORAL_CAPSULE | Freq: Every day | ORAL | 0 refills | Status: DC

## 2018-11-05 NOTE — Telephone Encounter (Signed)
Fax from [facility] that her new insurance does not cover Adderall XR twice daily so that she requests the 15 mg twice daily change to 30 mg XR once every morning.  When I returned the call as she requests, she indicates job change so that her life is much better now with a reasonable schedule not working until 2300 wishing to change to the once daily 30 mg XR Adderall dosing.  Last fill was at [facility] so that prescription #60 is sent there medically necessary with no contraindication with  registry of controlled substances supporting she has had third fill of 08/11/2018 Adderall and can send in the new supply to fill any time for change over.

## 2018-12-03 ENCOUNTER — Telehealth: Payer: Self-pay | Admitting: Psychiatry

## 2018-12-03 ENCOUNTER — Other Ambulatory Visit: Payer: Self-pay

## 2018-12-03 DIAGNOSIS — F902 Attention-deficit hyperactivity disorder, combined type: Secondary | ICD-10-CM

## 2018-12-03 MED ORDER — AMPHETAMINE-DEXTROAMPHET ER 30 MG PO CP24: 30.0000 mg | ORAL_CAPSULE | Freq: Every day | ORAL | 0 refills | Status: DC

## 2018-12-03 NOTE — Telephone Encounter (Signed)
Pt left v-mail need refill Adderall  [facility]  [address] [place]

## 2018-12-03 NOTE — Telephone Encounter (Signed)
[facility] requests a fill of Adderall for patient 15 mg XR twice daily #60 when product was changed last month from the fill on February 2 for Adderall 15 mg XR to that on February 13 for Adderall 30 mg XR once in the morning as required by her [employer] and better for her new job time schedule.  After multiple attempts, I reach patient by phone who confirms her insurance only covers the 30 mg XR and she has had private paid for the 15 mg XR the last few fills per [place] registry.  Adderall 30 mg XR #30 is 1 daily is sent to [facility] medically necessary with no contraindication.

## 2018-12-03 NOTE — Telephone Encounter (Signed)
rx submitted to provider for review and approval

## 2018-12-22 ENCOUNTER — Other Ambulatory Visit: Payer: Self-pay | Admitting: Psychiatry

## 2018-12-31 ENCOUNTER — Telehealth: Payer: Self-pay | Admitting: Psychiatry

## 2018-12-31 DIAGNOSIS — F902 Attention-deficit hyperactivity disorder, combined type: Secondary | ICD-10-CM

## 2018-12-31 MED ORDER — AMPHETAMINE-DEXTROAMPHET ER 30 MG PO CP24: 30.0000 mg | ORAL_CAPSULE | Freq: Every day | ORAL | 0 refills | Status: DC

## 2018-12-31 NOTE — Telephone Encounter (Signed)
Last appointment 08/11/2018 with last fill of Adderall 12/07/2018 now on time due for her request for Adderall 30 mg XR every morning #30 with no refill medically necessary sent to [facility] on [address] in [place].

## 2018-12-31 NOTE — Telephone Encounter (Signed)
Patient need refill on Adderall 30 mg. Send to [facility] on [address] in [place]

## 2019-01-25 ENCOUNTER — Other Ambulatory Visit: Payer: Self-pay

## 2019-01-25 ENCOUNTER — Telehealth: Payer: Self-pay | Admitting: Psychiatry

## 2019-01-25 DIAGNOSIS — F902 Attention-deficit hyperactivity disorder, combined type: Secondary | ICD-10-CM

## 2019-01-25 MED ORDER — AMPHETAMINE-DEXTROAMPHET ER 30 MG PO CP24: 30.0000 mg | ORAL_CAPSULE | Freq: Every day | ORAL | 0 refills | Status: DC

## 2019-01-25 NOTE — Telephone Encounter (Signed)
Andrea Oliver called to request refill of her Adderall.  Next appt 02/09/19.  Send to [facility] in [place] on [address]

## 2019-01-25 NOTE — Telephone Encounter (Signed)
Pended for provider for approval  Last fill 04/09

## 2019-02-05 ENCOUNTER — Telehealth: Payer: Self-pay | Admitting: Psychiatry

## 2019-02-05 ENCOUNTER — Other Ambulatory Visit: Payer: Self-pay

## 2019-02-05 DIAGNOSIS — F411 Generalized anxiety disorder: Secondary | ICD-10-CM

## 2019-02-05 DIAGNOSIS — F3181 Bipolar II disorder: Secondary | ICD-10-CM

## 2019-02-05 MED ORDER — CLONAZEPAM 0.5 MG PO TABS: 0.5000 mg | ORAL_TABLET | Freq: Two times a day (BID) | ORAL | 0 refills | Status: DC | PRN

## 2019-02-05 MED ORDER — LAMOTRIGINE 200 MG PO TABS: 200.0000 mg | ORAL_TABLET | Freq: Every morning | ORAL | 0 refills | Status: DC

## 2019-02-05 MED ORDER — QUETIAPINE FUMARATE ER 200 MG PO TB24: 200.0000 mg | ORAL_TABLET | Freq: Every day | ORAL | 0 refills | Status: DC

## 2019-02-05 NOTE — Telephone Encounter (Signed)
Prescription sent as required by patient and [facility] home delivery pharmacy (not [facility]), with Klonopin not accepted as electronic E scription's of controlled substances are not accepted by that pharmacy.  Can only address this with patient at appointment 02/09/2019.

## 2019-02-05 NOTE — Telephone Encounter (Signed)
Last appointment 08/11/2018 now scheduled 02/09/2019 needing refills to [facility] for insurance change away from [facility] sending Seroquel 200 mg XR every night, Lamictal 200 mg every morning, and Klonopin 0.5 mg twice daily as needed, though she rarely takes the Klonopin, each as a 27-month supply as required by mail order pharmacy.

## 2019-02-05 NOTE — Telephone Encounter (Signed)
Receiving refill request from [facility], not [facility] like previous.

## 2019-02-09 ENCOUNTER — Ambulatory Visit (INDEPENDENT_AMBULATORY_CARE_PROVIDER_SITE_OTHER): Payer: 59 | Admitting: Psychiatry

## 2019-02-09 ENCOUNTER — Other Ambulatory Visit: Payer: Self-pay

## 2019-02-09 ENCOUNTER — Encounter: Payer: Self-pay | Admitting: Psychiatry

## 2019-02-09 VITALS — Wt 205.0 lb

## 2019-02-09 DIAGNOSIS — F3181 Bipolar II disorder: Secondary | ICD-10-CM

## 2019-02-09 DIAGNOSIS — F411 Generalized anxiety disorder: Secondary | ICD-10-CM | POA: Diagnosis not present

## 2019-02-09 DIAGNOSIS — F902 Attention-deficit hyperactivity disorder, combined type: Secondary | ICD-10-CM

## 2019-02-09 DIAGNOSIS — F5081 Binge eating disorder: Secondary | ICD-10-CM | POA: Diagnosis not present

## 2019-02-09 MED ORDER — QUETIAPINE FUMARATE ER 200 MG PO TB24
200.0000 mg | ORAL_TABLET | Freq: Every day | ORAL | 1 refills | Status: DC
Start: 2019-02-09 — End: 2019-02-16

## 2019-02-09 MED ORDER — AMPHETAMINE-DEXTROAMPHET ER 30 MG PO CP24
30.0000 mg | ORAL_CAPSULE | Freq: Every day | ORAL | 0 refills | Status: DC
Start: 2019-03-26 — End: 2019-07-27

## 2019-02-09 MED ORDER — LAMOTRIGINE 200 MG PO TABS: 200.0000 mg | ORAL_TABLET | Freq: Every morning | ORAL | 1 refills | Status: DC

## 2019-02-09 MED ORDER — AMPHETAMINE-DEXTROAMPHET ER 30 MG PO CP24
30.0000 mg | ORAL_CAPSULE | Freq: Every day | ORAL | 0 refills | Status: DC
Start: 2019-02-24 — End: 2019-06-25

## 2019-02-09 MED ORDER — CLONAZEPAM 0.5 MG PO TABS: 0.5000 mg | ORAL_TABLET | Freq: Two times a day (BID) | ORAL | 1 refills | Status: DC | PRN

## 2019-02-09 MED ORDER — AMPHETAMINE-DEXTROAMPHET ER 30 MG PO CP24
30.0000 mg | ORAL_CAPSULE | Freq: Every day | ORAL | 0 refills | Status: DC
Start: 2019-04-25 — End: 2019-05-25

## 2019-02-09 NOTE — Progress Notes (Signed)
[facility]  Patient ID: Andrea Oliver,  MRN: [id number]  PCP: System, Provider Not In  Date of Evaluation: 02/09/2019 Time spent:20 minutes from 1650 to 1710  Chief Complaint:  Chief Complaint    Depression; Manic Behavior; Anxiety; ADHD; Eating Disorder      HISTORY/CURRENT STATUS: [place] is provided telemedicine audiovisual appointment session with consent with Epic collateral for psychiatric interview and exam in 6-month evaluation and management of bipolar II, GAD, ADHD and binge eating disorder.  After 6 years of Seroquel 400 mg XR nightly, dose was reduced to 200 mg XR 9 months ago. In the interim since last appointment 6 months ago, the patient has reduced her weight 30 pounds.  She and husband canceled their [place] anniversary trip due to coronavirus stay at home pandemic.  She remains on layoff from work but husband has been restarted on the job working from home including across the room during session today.  General medical care in the interim schedule and continued psychiatric medications with Seroquel labs otherwise with changes in Adderall necessitated by insurance after Vyvanse from last August was unacceptable for the insurance economics.  Work, relations, and self care are otherwise improving still valuing her new job comfortable for the first time having the layoff time to restore the home and her personal life.  She has no current mania, psychosis, suicidality, or substance use.  Depression       The patient presents with depression a recurrent problem.  The current episode started more than 1 year ago.   The onset quality is sudden.   The problem occurs intermittently.  The most recent episode lasted 6 weeks.    The problem has been gradually improving since onset.  Associated symptoms include decreased concentration, restlessness and decreased interest.  Associated symptoms include no helplessness, no hopelessness and no suicidal ideas.     The symptoms are  aggravated by work stress, medication and social issues.  Past treatments include other medications.  Compliance with treatment is good.  Past compliance problems include insurance issues and pharmacy issues.  Previous treatment provided moderate relief.  Risk factors include family history of mental illness, family history, a change in medication usage/dosage, major life event, alcohol intake, history of self-injury and substance abuse.   Past medical history includes anxiety, bipolar disorder, depression, mental health disorder and head trauma.     Pertinent negatives include no fibromyalgia, no thyroid problem, no recent illness, no physical disability, no terminal illness, no brain trauma, no obsessive-compulsive disorder, no post-traumatic stress disorder, no schizophrenia and no suicide attempts.   Individual Medical History/ Review of Systems: Changes? :Yes PCP performed GME 11/19/2018 for complaint of shortness of breath finding ferritin low at 14 with hemoglobin low at 11.6 starting patient on iron to recheck in 3 months.  She was also provided an albuterol inhaler used twice in the interim 3 months for allergic rhinitis with pollen sensitivity.  Triglycerides were normal at 100 with reference range 0-1 49 with fasting glucose 83 and remainder of labs normal except MCV low at 71.4 and MCH at 24.2 with hematocrit 34.3. Lipid panel (11/19/2018 5:53 PM EST) Lipid panel (11/19/2018 5:53 PM EST)  Component Value Ref Range Performed At Pathologist [facility]  Cholesterol, Total 146 100 - 199 mg/dL [facility]   Triglycerides 100 0 - 149 mg/dL [facility]   HDL 48 >39 mg/dL [facility]   VLDL Cholesterol Cal 20 5 - 40 mg/dL [facility]   LDL 78 0 -   99 mg/dL [facility]   TSH (11/19/2018 5:53 PM EST) TSH (11/19/2018 5:53 PM EST)  Component Value Ref Range Performed At [facility]  TSH 1.670 0.450 - 4.500 uIU/mL [facility]   Comprehensive metabolic panel (47/82/9562 5:53 PM EST) Comprehensive  metabolic panel (13/04/6577 5:53 PM EST)  Component Value Ref Range Performed At [facility]ologist [facility]  Glucose 83 65 - 99 mg/dL [facility]   BUN 8 6 - 24 mg/dL [facility]   Creatinine, Serum 0.68 0.57 - 1.00 mg/dL [facility]   eGFR If NonAfrican American 109 >59 mL/min/1.73 [facility]   eGFR If African American 126 >59 mL/min/1.73 [facility]   BUN/Creatinine Ratio 12 9 - 23 [facility]   Sodium 141 134 - 144 mmol/L [facility]   Potassium 4.2 3.5 - 5.2 mmol/L [facility]   Chloride 106 96 - 106 mmol/L [facility]   CO2 21 20 - 29 mmol/L [facility]   CALCIUM 9.2 8.7 - 10.2 mg/dL [facility]   Total Protein 6.7 6.0 - 8.5 g/dL [facility]   Albumin, Serum 4.3Comment:  **Please note reference interval change** 3.8 - 4.8 g/dL [facility]   Globulin, Total 2.4 1.5 - 4.5 g/dL [facility]   Albumin/Globulin Ratio 1.8 1.2 - 2.2 [facility]   Total Bilirubin <0.2 0.0 - 1.2 mg/dL [facility]   Alkaline Phosphatase 93 39 - 117 IU/L [facility]   AST 15 0 - 40 IU/L [facility]   ALT (SGPT) 8 0 - 32 IU/L [facility]    Blood Pressure 137/88 11/19/2018 5:13 PM EST   Pulse 81 11/19/2018 5:13 PM EST   Temperature 36.8 C (98.3 F) 11/19/2018 5:13 PM EST   Respiratory Rate 16 11/19/2018 5:13 PM EST   Oxygen Saturation 98% 11/19/2018 5:13 PM EST ra  Inhaled Oxygen Concentration - -   Weight 99.5 kg (219 lb 4.8 oz) 11/19/2018 5:13 PM EST   Height 162.6 cm (5' 4") 11/19/2018 5:13 PM EST   Body Mass Index 37.64 11/19/2018 5:13 PM EST   Reports weight is down to 205 pounds will have follow-up of anemia in 3 months with PCP.  Allergies: Aspirin and Nsaids mechanism of triglyceride reduction in the interim.  Current Medications:  Current Outpatient Medications:  .  [START ON 02/24/2019] amphetamine-dextroamphetamine (ADDERALL XR) 30 MG 24 hr capsule, Take 1 capsule (30 mg total) by mouth daily after breakfast for 30 days., Disp: 30 capsule, Rfl: 0 .  [START ON 03/26/2019] amphetamine-dextroamphetamine (ADDERALL  XR) 30 MG 24 hr capsule, Take 1 capsule (30 mg total) by mouth daily after breakfast for 30 days., Disp: 30 capsule, Rfl: 0 .  [START ON 04/25/2019] amphetamine-dextroamphetamine (ADDERALL XR) 30 MG 24 hr capsule, Take 1 capsule (30 mg total) by mouth daily after breakfast for 30 days., Disp: 30 capsule, Rfl: 0 .  clonazePAM (KLONOPIN) 0.5 MG tablet, Take 1 tablet (0.5 mg total) by mouth 2 (two) times daily as needed for anxiety., Disp: 180 tablet, Rfl: 1 .  lamoTRIgine (LAMICTAL) 200 MG tablet, Take 1 tablet (200 mg total) by mouth every morning., Disp: 90 tablet, Rfl: 1 .  QUEtiapine (SEROQUEL XR) 200 MG 24 hr tablet, Take 1 tablet (200 mg total) by mouth at bedtime., Disp: 180 tablet, Rfl: 1 Medication Side Effects: none  Family Medical/ Social History: Changes? Yes she and husband received for low for 1-1/2 months from current job place she very much wants to restore husband now back on the job working  from home and she hopes to do the same within 1 to 2 months but appreciates the time off as she restores the home side and other aspects of health losing weight 205 pounds from previous 235 six months ago.  MENTAL HEALTH EXAM:  Weight 205 lb (93 kg).Body mass index is 34.11 kg/m.  Self-report from home scale as not present in office.  General Appearance: Casual, Fairly Groomed and Guarded  Eye Contact:  Fair  Speech:  Clear and Coherent, Normal Rate and Talkative  Volume:  Normal  Mood:  Anxious, Dysphoric, Euthymic and Worthless  Affect:  Inappropriate, Labile, Full Range and Anxious  Thought Process:  Goal Directed, Irrelevant and Linear  Orientation:  Full (Time, Place, and Person)  Thought Content: Ilusions, Obsessions and Rumination   Suicidal Thoughts:  No  Homicidal Thoughts:  No  Memory:  Immediate;   Good Remote;   Good  Judgement:  Intact  Insight:  Fair  Psychomotor Activity:  Normal, Increased, Mannerisms and Restlessness  Concentration:  Concentration: Fair and Attention  Span: Fair  Recall:  Good  Fund of Knowledge: Good  Language: Good  Assets:  Leisure Time Resilience Talents/Skills  ADL's:  Intact  Cognition: WNL  Prognosis:  Good    DIAGNOSES:    ICD-10-CM   1. Bipolar II disorder, mild, depressed, with mixed features, in full remission (HCC) F31.81 lamoTRIgine (LAMICTAL) 200 MG tablet    QUEtiapine (SEROQUEL XR) 200 MG 24 hr tablet    clonazePAM (KLONOPIN) 0.5 MG tablet  2. Generalized anxiety disorder F41.1 QUEtiapine (SEROQUEL XR) 200 MG 24 hr tablet    clonazePAM (KLONOPIN) 0.5 MG tablet  3. 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 Binge eating disorder F50.81     Receiving Psychotherapy: No    RECOMMENDATIONS: Seroquel 200 mg XR every bedtime is E scribed #180 with 1 refill to [facility] home delivery for bipolar disorder.  Lamictal 200 mg every morning is E scribed to [facility] home delivery as #90 with 1 refill for bipolar and generalized anxiety.  Klonopin 0.5 mg twice daily as needed for anxiety is E scribed #180 with 1 ref[facility] to [facility] in [place] on [address] as [facility] home delivery did not accept this eScription 02/05/2019 thereby resent today for generalized anxiety.  Adderall is continued as 30 mg XR every morning after breakfast sent as a month supply each for June 3, July 3, and August 2 to [facility] [place] on [address] for ADHD and binge eating disorder.  Psychoeducation and psychosupportive therapy are updated for interim accomplishments and stressors to return for follow-up in 6 months.  Virtual Visit via Video Note  I connected with Manami J Napier on 02/09/19 at  4:40 PM EDT by a video enabled telemedicine application and verified that I am speaking with the correct person using two  identifiers.  Location: Patient: Individually with [place] and home residence though husband working from home across the room Provider: [facility]   I discussed the limitations of evaluation and management by telemedicine and the availability of in person appointments. The patient expressed understanding and agreed to proceed.  History of Present Illness:  6-month evaluation and management address bipolar II, GAD, ADHD and binge eating disorder.  After 6 years of Seroquel 400 mg XR nightly, dose was   reduced to 200 mg XR 9 months ago. In the interim since last appointment 6 months ago, the patient has reduced her weight 30 pounds.    Observations/Objective: Mood:  Anxious, Dysphoric, Euthymic and Worthless  Affect:  Inappropriate, Labile, Full Range and Anxious  Thought Process:  Goal Directed, Irrelevant and Linear  Orientation:  Full (Time, Place, and Person)  Thought Content: Ilusions, Obsessions and Rumination     Assessment and Plan: Seroquel 200 mg XR every bedtime is E scribed #180 with 1 refill to [facility] home delivery for bipolar disorder.  Lamictal 200 mg every morning is E scribed to [facility] home delivery as #90 with 1 refill for bipolar and generalized anxiety.  Klonopin 0.5 mg twice daily as needed for anxiety is E scribed #180 with 1 [facility] to [facility] in [place] on [address] as [facility] home delivery did not accept this eScription 02/05/2019 thereby resent today for generalized anxiety.  Adderall is continued as 30 mg XR every morning after breakfast sent as a month supply each for June 3, July 3, and August 2 to [facility] [place] on [address] for ADHD and binge eating disorder.  Follow Up Instructions: Psychoeducation and psychosupportive therapy are updated for interim accomplishments and stressors to return for follow-up in 6 months.   I discussed the assessment and treatment plan with the patient. The patient was provided an opportunity to  ask questions and all were answered. The patient agreed with the plan and demonstrated an understanding of the instructions.   The patient was advised to call back or seek an in-person evaluation if the symptoms worsen or if the condition fails to improve as anticipated.  I provided 20 minutes of non-face-to-face time during this encounter. [employer] meeting #[id number] Meeting password [email or web address]   E , MD    E , MD  

## 2019-02-09 NOTE — Telephone Encounter (Signed)
As [facility] home delivery [facility]) did not accept [employer] submissions of controlled substance Klonopin while [place] now requires [employer] of controlled substances since the start of 2020, the patient agreed to send the Klonopin 0.5 mg twice daily #180 with 1 refill to [facility] in [place] in place of the attempt to send to [place] home delivery now canceled.

## 2019-02-16 ENCOUNTER — Other Ambulatory Visit: Payer: Self-pay

## 2019-02-16 DIAGNOSIS — F3181 Bipolar II disorder: Secondary | ICD-10-CM

## 2019-02-16 DIAGNOSIS — F411 Generalized anxiety disorder: Secondary | ICD-10-CM

## 2019-02-16 MED ORDER — QUETIAPINE FUMARATE ER 200 MG PO TB24: 200.0000 mg | ORAL_TABLET | Freq: Every day | ORAL | 1 refills | Status: DC

## 2019-02-24 ENCOUNTER — Telehealth: Payer: Self-pay | Admitting: Psychiatry

## 2019-02-24 NOTE — Telephone Encounter (Signed)
Pt needs refill on adderall sent to [facility] on [address] in [place], [place]

## 2019-02-24 NOTE — Telephone Encounter (Signed)
Submitted today already

## 2019-05-25 ENCOUNTER — Telehealth: Payer: Self-pay | Admitting: Psychiatry

## 2019-05-25 ENCOUNTER — Other Ambulatory Visit: Payer: Self-pay

## 2019-05-25 DIAGNOSIS — F902 Attention-deficit hyperactivity disorder, combined type: Secondary | ICD-10-CM

## 2019-05-25 MED ORDER — AMPHETAMINE-DEXTROAMPHET ER 30 MG PO CP24: 30.0000 mg | ORAL_CAPSULE | Freq: Every day | ORAL | 0 refills | Status: DC

## 2019-05-25 NOTE — Telephone Encounter (Signed)
Last refill 04/26/2019, pt due back in Nov. 2020 Pended for provider

## 2019-05-25 NOTE — Telephone Encounter (Signed)
Pt called to request refill for Adderall @ [facility]

## 2019-05-25 NOTE — Telephone Encounter (Signed)
Last seen 02/09/2019 with all 3 scripts from that appointment filled, now due Adderall 30 mg XR every morning #30 no refill medically necessary no contraindication to cover interim to next appointment

## 2019-06-25 ENCOUNTER — Telehealth: Payer: Self-pay | Admitting: Psychiatry

## 2019-06-25 ENCOUNTER — Other Ambulatory Visit: Payer: Self-pay

## 2019-06-25 DIAGNOSIS — F902 Attention-deficit hyperactivity disorder, combined type: Secondary | ICD-10-CM

## 2019-06-25 MED ORDER — AMPHETAMINE-DEXTROAMPHET ER 30 MG PO CP24
30.0000 mg | ORAL_CAPSULE | Freq: Every day | ORAL | 0 refills | Status: DC
Start: 2019-06-25 — End: 2019-07-27

## 2019-06-25 NOTE — Telephone Encounter (Signed)
Andrea Oliver called to request refill of her Adderall.  Appt 07/27/19.  Send to [facility] on [address] in [place]

## 2019-06-25 NOTE — Telephone Encounter (Signed)
After last appointment 02/09/2019, next appointment is for next month needing interim Adderall 30 mg XR every morning #30 sent to [facility] medically necessary no contraindication including [place] registry.

## 2019-06-25 NOTE — Telephone Encounter (Signed)
Last refill 09/02 Pended for approval

## 2019-07-23 ENCOUNTER — Telehealth: Payer: Self-pay | Admitting: Psychiatry

## 2019-07-23 ENCOUNTER — Other Ambulatory Visit: Payer: Self-pay

## 2019-07-23 DIAGNOSIS — F902 Attention-deficit hyperactivity disorder, combined type: Secondary | ICD-10-CM

## 2019-07-23 MED ORDER — AMPHETAMINE-DEXTROAMPHET ER 30 MG PO CP24: 30.0000 mg | ORAL_CAPSULE | Freq: Every day | ORAL | 0 refills | Status: DC

## 2019-07-23 NOTE — Telephone Encounter (Signed)
Last refill 06/26/2019, pended for approval

## 2019-07-23 NOTE — Telephone Encounter (Signed)
Medically necessary Adderall escribed to [facility] with reminder that office appointment mandated to receive subsequent fils otherwise no contraindication.

## 2019-07-23 NOTE — Telephone Encounter (Signed)
Pt has appt 11/3 refill is due 11/1. Requesting to refill Adderall XR 30 mg @ [facility] location on file

## 2019-07-27 ENCOUNTER — Other Ambulatory Visit: Payer: Self-pay

## 2019-07-27 ENCOUNTER — Encounter: Payer: Self-pay | Admitting: Psychiatry

## 2019-07-27 ENCOUNTER — Ambulatory Visit (INDEPENDENT_AMBULATORY_CARE_PROVIDER_SITE_OTHER): Payer: 59 | Admitting: Psychiatry

## 2019-07-27 VITALS — Ht 65.0 in | Wt 185.0 lb

## 2019-07-27 DIAGNOSIS — F411 Generalized anxiety disorder: Secondary | ICD-10-CM | POA: Diagnosis not present

## 2019-07-27 DIAGNOSIS — F3181 Bipolar II disorder: Secondary | ICD-10-CM | POA: Diagnosis not present

## 2019-07-27 DIAGNOSIS — F5081 Binge eating disorder: Secondary | ICD-10-CM | POA: Diagnosis not present

## 2019-07-27 DIAGNOSIS — F902 Attention-deficit hyperactivity disorder, combined type: Secondary | ICD-10-CM

## 2019-07-27 MED ORDER — AMPHETAMINE-DEXTROAMPHET ER 30 MG PO CP24: 30.0000 mg | ORAL_CAPSULE | Freq: Every day | ORAL | 0 refills | Status: DC

## 2019-07-27 MED ORDER — CLONAZEPAM 0.5 MG PO TABS: 0.5000 mg | ORAL_TABLET | Freq: Two times a day (BID) | ORAL | 1 refills | Status: DC | PRN

## 2019-07-27 MED ORDER — QUETIAPINE FUMARATE ER 200 MG PO TB24: 200.0000 mg | ORAL_TABLET | Freq: Every day | ORAL | 1 refills | Status: DC

## 2019-07-27 MED ORDER — LAMOTRIGINE 200 MG PO TABS: 200.0000 mg | ORAL_TABLET | Freq: Every morning | ORAL | 1 refills | Status: DC

## 2019-07-27 NOTE — Progress Notes (Signed)
[facility]  Patient ID: Andrea Oliver,  MRN: [id number]  PCP: System, Provider Not In  Date of Evaluation: 07/27/2019 Time spent:20 minutes from 1140 to 1200  Chief Complaint:  Chief Complaint    Depression; Manic Behavior; Anxiety; ADHD; Eating Disorder      HISTORY/CURRENT STATUS: Andrea Oliver is provided telemedicine audiovisual appointment session video to video individually visually evident to have further weight loss reducing 30 pounds over the 6 months prior to last appointment and now 15 pounds in the interim with consent with epic collateral for psychiatric interview and exam in 51-month evaluation and management of bipolar II, ADD, ADHD, and binge eating disorder.  Reduction of Seroquel 400 mg XR of which the patient was frightened after 6 years down to 200 mg XR nightly has helped patient's sociability and communication today.  Niece and nephew have Covid stressing the family 2 weeks ago getting well but needing her brother with diabetes and her mother in her 74s not to get sick.  She and daughter traveled in June and she travels with husband in September.  Medication changes are not needed or acceptable to patient at this time as clinically she hesitates to reduce any medication during these stressors.  [place] registry documents last Adderall dispensing on 07/23/2019 and Klonopin on 05/23/2019.  Only substance use is social alcohol a couple of times weekly.  She has no mania, suicidality, psychosis, or delirium.   Depression  The patient presents withdepression a recurrentcyclic problem.The current episode started more than 1 year ago. The onset quality is sudden. The problem occurs intermittently.The most recent episode lasted 6 weeks. The problem has been gradually improvingsince onset.Associated symptoms include decreased concentration, emotional vulnerability,and decreased interest. Associated symptoms include no helplessness,no hopelessness, no  restlessness,and no suicidal ideas.The symptoms are aggravated by work stress, medication and social issues.Past treatments include other medications.Compliance with treatment is good.Past compliance problems include insurance issues and pharmacy issues.Previous treatment provided moderaterelief.Risk factors include family history of mental illness, family history, a change in medication usage/dosage, major life event, alcohol intake, history of self-injury and substance abuse. Past medical history includes anxiety,bipolar disorder,depression,mental health disorderand head trauma. Pertinent negatives include no fibromyalgia,no thyroid problem,no recent illness,no physical disability,no terminal illness,no brain trauma,no obsessive-compulsive disorder,no post-traumatic stress disorder,no schizophreniaand no suicide attempts.  Individual Medical History/ Review of Systems: Changes? :Yes   being called back to work end of August from layoff for coronavirus now also working from home like husband having a drug screen to return in August and having an MRI of the left knee in July pain otherwise medically cleared.  Family is now healthy.  She plans her next general medical exam in February labs planned for that time last being 11/19/2017.  Allergies: Aspirin and Nsaids  Current Medications:  Current Outpatient Medications:  .  [START ON 08/22/2019] amphetamine-dextroamphetamine (ADDERALL XR) 30 MG 24 hr capsule, Take 1 capsule (30 mg total) by mouth daily after breakfast., Disp: 30 capsule, Rfl: 0 .  [START ON 09/21/2019] amphetamine-dextroamphetamine (ADDERALL XR) 30 MG 24 hr capsule, Take 1 capsule (30 mg total) by mouth daily after breakfast., Disp: 30 capsule, Rfl: 0 .  [START ON 10/21/2019] amphetamine-dextroamphetamine (ADDERALL XR) 30 MG 24 hr capsule, Take 1 capsule (30 mg total) by mouth daily after breakfast., Disp: 30 capsule, Rfl: 0 .  clonazePAM (KLONOPIN) 0.5 MG  tablet, Take 1 tablet (0.5 mg total) by mouth 2 (two) times daily as needed for anxiety., Disp: 180 tablet, Rfl: 1 .  lamoTRIgine (  LAMICTAL) 200 MG tablet, Take 1 tablet (200 mg total) by mouth every morning., Disp: 90 tablet, Rfl: 1 .  QUEtiapine (SEROQUEL XR) 200 MG 24 hr tablet, Take 1 tablet (200 mg total) by mouth at bedtime., Disp: 90 tablet, Rfl: 1   Medication Side Effects: none  Family Medical/ Social History: Changes? No  MENTAL HEALTH EXAM:  Height 5\' 5"  (1.651 m), weight 185 lb (83.9 kg).Body mass index is 30.79 kg/m. Postural reflexes and gait 0/0 and AIMS = 0.  General Appearance: Casual, Meticulous and Well Groomed  Eye Contact:  Good  Speech:  Clear and Coherent, Normal Rate and Talkative  Volume:  Normal  Mood:  Anxious, Dysphoric and Euthymic  Affect:  Congruent, Depressed, Full Range and Anxious  Thought Process:  Coherent, Irrelevant, Linear and Descriptions of Associations: Tangential  Orientation:  Full (Time, Place, and Person)  Thought Content: Rumination and Tangential   Suicidal Thoughts:  No  Homicidal Thoughts:  No  Memory:  Immediate;   Good Remote;   Good  Judgement:  Good  Insight:  Fair  Psychomotor Activity:  Normal and Mannerisms  Concentration:  Concentration: Fair and Attention Span: Good  Recall:  Good  Fund of Knowledge: Good  Language: Good  Assets:  Desire for Improvement Intimacy Leisure Time Resilience Talents/Skills  ADL's:  Intact  Cognition: WNL  Prognosis:  Good    DIAGNOSES:    ICD-10-CM   1. Bipolar II disorder, mild, depressed, with mixed features, in full remission (HCC)  F31.81 lamoTRIgine (LAMICTAL) 200 MG tablet    QUEtiapine (SEROQUEL XR) 200 MG 24 hr tablet    clonazePAM (KLONOPIN) 0.5 MG tablet  2. Generalized anxiety disorder  F41.1 QUEtiapine (SEROQUEL XR) 200 MG 24 hr tablet    clonazePAM (KLONOPIN) 0.5 MG tablet  3. Attention deficit hyperactivity disorder (ADHD), combined type, mild  F90.2  amphetamine-dextroamphetamine (ADDERALL XR) 30 MG 24 hr capsule    amphetamine-dextroamphetamine (ADDERALL XR) 30 MG 24 hr capsule    amphetamine-dextroamphetamine (ADDERALL XR) 30 MG 24 hr capsule  4. Binge eating disorder  F50.81     Receiving Psychotherapy: No    RECOMMENDATIONS: Psychosupportive psychoeducation establishes treatment symptom matching for cognitive behavioral nutrition, sleep hygiene, social skills, and frustration management interventions with medications all stable, helpful, and well-tolerated.  Next laboratory testing will be at [facility] on 11/20/2019.  She is E scribed Seroquel 200 mg XR every bedtime as #90 with 1 refill sent to [facility] home delivery for bipolar and generalized anxiety disorders.  She is E scribed Lamictal 200 mg every morning sent as #90 with 1 refill to [facility] home delivery for bipolar disorder.  She is E scribed Klonopin 0.5 mg twice daily as needed sent as #180 with 1 refill to [facility] for generalized anxiety and bipolar disorder.  Relatives E scribed 30 mg XR every morning as #30 each for November 29, December 29, and January 28 to [facility].  She returns in 6 months for follow-up.   Virtual Visit via Video Note  I connected with Andrea Oliver on 07/27/19 at 11:40 AM EST by a video enabled telemedicine application and verified that I am speaking with the correct person using two identifiers.  Location: Patient: WebEx video/audio individually at family residence Provider: [facility]   I discussed the limitations of evaluation and management by telemedicine and the availability of in person appointments. The patient expressed understanding and agreed to proceed.  History of Present Illness:  30-month evaluation  and management address bipolar II, ADD, ADHD, and binge eating disorder.    Observations/Objective: Mood:  Anxious, Dysphoric and Euthymic  Affect:  Congruent, Depressed, Full Range and Anxious   Thought Process:  Coherent, Irrelevant, Linear and Descriptions of Associations: Tangential   Assessment and Plan: Psychosupportive psychoeducation establishes treatment symptom matching for cognitive behavioral nutrition, sleep hygiene, social skills, and frustration management interventions with medications all stable, helpful, and well-tolerated.  Next laboratory testing will be at [facility] on 11/20/2019.  She is E scribed Seroquel 200 mg XR every bedtime as #90 with 1 [facility] for bipolar and generalized anxiety disorders.  She is E scribed Lamictal 200 mg every morning sent as #90 with 1 refill to [facility] home delivery for bipolar disorder.  She is E scribed Klonopin 0.5 mg twice daily as needed sent as #180 with [facility] for generalized anxiety and bipolar disorder.  Relatives E scribed 30 mg XR every morning as #30 each for November 29, December 29, and January 28 to [facility].  Follow Up Instructions: She returns in 6 months for follow-up.   I discussed the assessment and treatment plan with the patient. The patient was provided an opportunity to ask questions and all were answered. The patient agreed with the plan and demonstrated an understanding of the instructions.   The patient was advised to call back or seek an in-person evaluation if the symptoms worsen or if the condition fails to improve as anticipated.  I provided 20 minutes of non-face-to-face time during this encounter. [employer] Web[employer] meeting #[id number]#[phone] Meeting password:  [email or web address]  Chauncey MannGlenn E Delman Goshorn, MD  Chauncey MannGlenn E Mikaelah Trostle, MD

## 2019-11-22 ENCOUNTER — Other Ambulatory Visit: Payer: Self-pay

## 2019-11-22 ENCOUNTER — Telehealth: Payer: Self-pay | Admitting: Psychiatry

## 2019-11-22 DIAGNOSIS — F902 Attention-deficit hyperactivity disorder, combined type: Secondary | ICD-10-CM

## 2019-11-22 MED ORDER — AMPHETAMINE-DEXTROAMPHET ER 30 MG PO CP24: 30.0000 mg | ORAL_CAPSULE | Freq: Every day | ORAL | 0 refills | Status: DC

## 2019-11-22 NOTE — Telephone Encounter (Signed)
Pt called requesting refill for Adderall XR 30 mg @ [facility] in [place] on file.Follow up due May

## 2019-11-22 NOTE — Telephone Encounter (Signed)
Adderall 30 mg XR #30 is medically necessary with no contraindication

## 2019-11-22 NOTE — Telephone Encounter (Signed)
Last refill 10/24/2019, last apt 07/2019 Pended for Dr. Marlyne Beards to submit

## 2019-12-27 ENCOUNTER — Telehealth: Payer: Self-pay | Admitting: Psychiatry

## 2019-12-27 ENCOUNTER — Other Ambulatory Visit: Payer: Self-pay

## 2019-12-27 DIAGNOSIS — F902 Attention-deficit hyperactivity disorder, combined type: Secondary | ICD-10-CM

## 2019-12-27 MED ORDER — AMPHETAMINE-DEXTROAMPHET ER 30 MG PO CP24: 30.0000 mg | ORAL_CAPSULE | Freq: Every day | ORAL | 0 refills | Status: DC

## 2019-12-27 NOTE — Telephone Encounter (Signed)
Last refill 11/23/2019, pended for Dr. Marlyne Beards

## 2019-12-27 NOTE — Telephone Encounter (Signed)
Pt called requesting refill for Adderall XR 30 mg at [facility] in [place] on file . Next appt due in may

## 2020-01-24 ENCOUNTER — Other Ambulatory Visit: Payer: Self-pay

## 2020-01-24 ENCOUNTER — Telehealth: Payer: Self-pay | Admitting: Psychiatry

## 2020-01-24 DIAGNOSIS — F902 Attention-deficit hyperactivity disorder, combined type: Secondary | ICD-10-CM

## 2020-01-24 NOTE — Telephone Encounter (Signed)
Pt called requesting refill for Adderall @ [facility] [address] Appt 5/4

## 2020-01-24 NOTE — Telephone Encounter (Signed)
Last refill 12/27/2019, pended for Dr. Marlyne Beards Patient has apt tomorrow afternoon

## 2020-01-25 ENCOUNTER — Telehealth (INDEPENDENT_AMBULATORY_CARE_PROVIDER_SITE_OTHER): Payer: 59 | Admitting: Psychiatry

## 2020-01-25 ENCOUNTER — Encounter: Payer: Self-pay | Admitting: Psychiatry

## 2020-01-25 DIAGNOSIS — F411 Generalized anxiety disorder: Secondary | ICD-10-CM | POA: Diagnosis not present

## 2020-01-25 DIAGNOSIS — F3181 Bipolar II disorder: Secondary | ICD-10-CM | POA: Diagnosis not present

## 2020-01-25 DIAGNOSIS — F410 Panic disorder [episodic paroxysmal anxiety] without agoraphobia: Secondary | ICD-10-CM | POA: Diagnosis not present

## 2020-01-25 DIAGNOSIS — F902 Attention-deficit hyperactivity disorder, combined type: Secondary | ICD-10-CM

## 2020-01-25 MED ORDER — CLONAZEPAM 0.5 MG PO TABS: 0.5000 mg | ORAL_TABLET | Freq: Two times a day (BID) | ORAL | 1 refills | Status: DC | PRN

## 2020-01-25 MED ORDER — AMPHETAMINE-DEXTROAMPHET ER 30 MG PO CP24: 30.0000 mg | ORAL_CAPSULE | Freq: Every day | ORAL | 0 refills | Status: DC

## 2020-01-25 NOTE — Progress Notes (Signed)
[facility]  Patient ID: Andrea Oliver,  MRN: [id number]  PCP: System, Provider Not In  Date of Evaluation: 01/25/2020 Time spent:20 minutes from 1400 to 1420  Chief Complaint:  Chief Complaint    Depression; Manic Behavior; Anxiety; ADHD      HISTORY/CURRENT STATUS: Andrea Oliver is provided telemedicine audiovisual session 20 minutes video to video individually at family residence in privacy with annual MyChart/Caregility consent read, explained, and documented with epic collateral for psychiatric interview and exam in 37-month evaluation and management of bipolar II, generalized anxiety disorder, ADHD, and now newly evident panic previously obscured by Seroquel associated binge overeating symptoms.  Patient had required high-dose Seroquel 400 mg XR in the course of  stabilization by Dr. Candis Oliver 8 years ago with Zyprexa, then Latuda, and then Seroquel following several years of Augmentin combined SSRI. Patient now clarifies that she gained 60 pounds on Seroquel 400 mg XR, now reducing Seroquel 50% has reduced weight 40 pounds so that her current weight still having the extra 20 pounds is acceptable.  We cannot reduce her Seroquel currently below 200 mg XR as she is having panic attacks now with inability to get enough breath having past perforated peptic ulcer requiring surgery 3 years ago when symptoms were acute.  However pulmonary insufficiency symptoms episodically now may be pre or early panic.  She has not gained extra weight since she returned to work in October though sitting all day again.  She drives only to see her mother otherwise being less active again.  Lamictal, Klonopin, Adderall, and Seroquel are otherwise unchanged. Insurance of last appointment required [facility] Delivery for Lamictal and Seroquel who require Klonopin and Adderall go to [facility] in [place] as [place] home delivery does not provide any controlled substances.  Yesterday, I had received from [facility] a request for Klonopin for the patient as they are now switching over from [place] home delivery to [place] so that that is okay to send.  The [employer] is requesting Adderall due to have 3 eScriptions sent though they request only one month.  These medications will be ordered appropriately as patient agrees but requesting to wait on Seroquel and Lamictal. [place] registry documents last Adderall dispensing was 12/27/2019 of a 30-day supply, and last Klonopin from [place] was 11/27/2018 as #180 therefore due [facility] supply by early June.  She will see primary care on Friday for inability to get a deep breath and chest pain associated anxiety that is relieved by Klonopin needing to rule out a recurrent GI illness such as reflux particularly relative to her proton pump inhibitor treatment of the past.  She also has vacation with husband and his 2 brothers and theirwives to [facility] in the near future for which she must prepare to travel.  She has no mania currently, psychosis, suicidality, or delirium.  Depression             The patient presents withdepression as a recurrentcyclic problem.The current episode started more than 1 year ago. The onset quality is sudden. The problem occurs intermittently.The most recent episode lasted 6 weeks. The problem has been gradually improvingsince onset.Associated symptoms include decreased concentration, emotional lability,prepanic vigilance, and decreased interest. Associated symptoms include no helplessness,no hopelessness, no restlessness,and no suicidal ideas.The symptoms are aggravated by work stress, medication and social issues.Past treatments include other medications.Compliance with treatment is good.Past compliance problems include insurance issues and pharmacy issues.Previous treatment provided moderaterelief.Risk factors include family history of mental illness, family history,  a change in medication  usage/dosage, major life event, alcohol intake, history of self-injury and substance abuse. Past medical history includes anxiety,bipolar disorder,depression,mental health disorderand head trauma. Pertinent negatives include no fibromyalgia,no thyroid problem,no recent illness,no physical disability,no terminal illness,no brain trauma,no obsessive-compulsive disorder,no post-traumatic stress disorder,no schizophreniaand no suicide attempts.  Psych individual Medical History/ Review of Systems: Changes? :Yes  weight is now stable not losing further but not gaining. She plans to see PCP next Friday for chest empty pressure with sense of inadequate respiration.  Allergies: Aspirin and Nsaids . Wellbutrin guidelines and day 7 [place] family and she patient did not previously current Medications:  Current Outpatient Medications:  .  amphetamine-dextroamphetamine (ADDERALL XR) 30 MG 24 hr capsule, Take 1 capsule (30 mg total) by mouth daily after breakfast., Disp: 30 capsule, Rfl: 0 .  [START ON 02/24/2020] amphetamine-dextroamphetamine (ADDERALL XR) 30 MG 24 hr capsule, Take 1 capsule (30 mg total) by mouth daily after breakfast., Disp: 30 capsule, Rfl: 0 .  [START ON 03/25/2020] amphetamine-dextroamphetamine (ADDERALL XR) 30 MG 24 hr capsule, Take 1 capsule (30 mg total) by mouth daily after breakfast., Disp: 30 capsule, Rfl: 0 .  clonazePAM (KLONOPIN) 0.5 MG tablet, Take 1 tablet (0.5 mg total) by mouth 2 (two) times daily as needed for anxiety., Disp: 180 tablet, Rfl: 1 .  lamoTRIgine (LAMICTAL) 200 MG tablet, Take 1 tablet (200 mg total) by mouth every morning., Disp: 90 tablet, Rfl: 1 .  QUEtiapine (SEROQUEL XR) 200 MG 24 hr tablet, Take 1 tablet (200 mg total) by mouth at bedtime., Disp: 90 tablet, Rfl: 1  Medication Side Effects: weight gain  Family Medical/ Social History: Changes? No  MENTAL HEALTH EXAM:  There were no vitals taken for this visit.There is no height or  weight on file to calculate BMI.  Not present here today.  General Appearance: Casual, Fairly Groomed, Guarded and Meticulous  Eye Contact:  Good  Speech:  Clear and Coherent, Normal Rate and Talkative  Volume:  Normal  Mood:  Anxious and Euthymic  Affect:  Congruent, Full Range and Anxious  Thought Process:  Coherent, Goal Directed, Irrelevant, Linear and Descriptions of Associations: Tangential  Orientation:  Full (Time, Place, and Person)  Thought Content: Rumination and Tangential   Suicidal Thoughts:  No  Homicidal Thoughts:  No  Memory:  Immediate;   Good Remote;   Good  Judgement:  Good  Insight:  Fair  Psychomotor Activity:  Normal and Mannerisms  Concentration:  Concentration: Fair and Attention Span: Fair  Recall:  [employer] of Knowledge: Good  Language: Good  Assets:  Desire for Improvement Intimacy Leisure Time Vocational/Educational  ADL's:  Intact  Cognition: WNL  Prognosis:  Good    DIAGNOSES:    ICD-10-CM   1. Bipolar II disorder, mild, depressed, with mixed features, in full remission (HCC)  F31.81 clonazePAM (KLONOPIN) 0.5 MG tablet  2. Generalized anxiety disorder  F41.1 clonazePAM (KLONOPIN) 0.5 MG tablet  3. Panic disorder  F41.0   4. Attention deficit hyperactivity disorder (ADHD), combined type, mild  F90.2 amphetamine-dextroamphetamine (ADDERALL XR) 30 MG 24 hr capsule    amphetamine-dextroamphetamine (ADDERALL XR) 30 MG 24 hr capsule    amphetamine-dextroamphetamine (ADDERALL XR) 30 MG 24 hr capsule    Receiving Psychotherapy: No    RECOMMENDATIONS: Though the patient is more positive about relations and activities, she is simultaneously  more aware of potential for panic, which in two formats of pathology may explain the past symptoms and treatment responses.  Still there is conclusion especially on the patient's part that current medications are adequate though she needs available Klonopin for increased panic and cannot reduce Seroquel any  further currently.  Possible need for antianxiety such as Luvox or Cymbalta may also be considered.  She will see PCP for medical clearance and call if further help with anxiety is clearly necessary.  We will continue Seroquel 200 mg XR every bedtime and Lamictal 200 mg every morning current supply being switched over to [facility] she prefers to be verified herself before escribing to [facility] for bipolar, panic, and generalized anxiety.  She apparently needs Klonopin requested by fax from [facility] sent as 0.5 mg twice daily as needed for panic or generalized anxiety #180 with 1 refill of which she improves.  Adderall is sent to [facility] as 30 mg XR every morning as #30 each for May 4, June 3, and July 3 for ADHD.  She returns for follow-up in 6 months or sooner if needed.  [email or web address] [phone]  Chauncey Mann, MD

## 2020-01-25 NOTE — Telephone Encounter (Signed)
[name], fiala are scheduled for a virtual visit with your provider today.    Just as we do with appointments in the office, we must obtain your consent to participate.  Your consent will be active for this visit and any virtual visit you may have with one of our providers in the next 365 days.    If you have a MyChart account, I can also send a copy of this consent to you electronically.  All virtual visits are billed to your insurance company just like a traditional visit in the office.  As this is a virtual visit, video technology does not allow for your provider to perform a traditional examination.  This may limit your provider's ability to fully assess your condition.  If your provider identifies any concerns that need to be evaluated in person or the need to arrange testing such as labs, EKG, etc, we will make arrangements to do so.    Although advances in technology are sophisticated, we cannot ensure that it will always work on either your end or our end.  If the connection with a video visit is poor, we may have to switch to a telephone visit.  With either a video or telephone visit, we are not always able to ensure that we have a secure connection.   I need to obtain your verbal consent now.   Are you willing to proceed with your visit today?   Andrea Oliver has provided verbal consent on 01/25/2020 for a virtual visit (video or telephone).   Chauncey Mann, MD 01/25/2020  2:05 PM

## 2020-02-11 ENCOUNTER — Other Ambulatory Visit: Payer: Self-pay | Admitting: Psychiatry

## 2020-02-11 DIAGNOSIS — F3181 Bipolar II disorder: Secondary | ICD-10-CM

## 2020-02-11 DIAGNOSIS — F411 Generalized anxiety disorder: Secondary | ICD-10-CM

## 2020-04-24 ENCOUNTER — Other Ambulatory Visit: Payer: Self-pay

## 2020-04-24 ENCOUNTER — Telehealth: Payer: Self-pay | Admitting: Psychiatry

## 2020-04-24 DIAGNOSIS — F902 Attention-deficit hyperactivity disorder, combined type: Secondary | ICD-10-CM

## 2020-04-24 MED ORDER — AMPHETAMINE-DEXTROAMPHET ER 30 MG PO CP24: 30.0000 mg | ORAL_CAPSULE | Freq: Every day | ORAL | 0 refills | Status: DC

## 2020-04-24 NOTE — Telephone Encounter (Signed)
3 eScription's for Adderall 30 mg XR morning from 01/25/2020 appointment have now been exhausted due for #30 no refill sent to [facility] where previous have been dispensed with no contraindication including by [place] registry for the last year

## 2020-04-24 NOTE — Telephone Encounter (Signed)
Last refill 03/25/20 Pended for Dr. Marlyne Beards to review and send. Has apt scheduled 07/2020

## 2020-04-24 NOTE — Telephone Encounter (Signed)
Andrea Oliver called and LM asking for refill of her Adderall   Appt 11/4.  Sent to [facility] on [address] [place], [place]

## 2020-05-26 ENCOUNTER — Telehealth: Payer: Self-pay | Admitting: Psychiatry

## 2020-05-26 ENCOUNTER — Other Ambulatory Visit: Payer: Self-pay

## 2020-05-26 DIAGNOSIS — F902 Attention-deficit hyperactivity disorder, combined type: Secondary | ICD-10-CM

## 2020-05-26 MED ORDER — AMPHETAMINE-DEXTROAMPHET ER 30 MG PO CP24: 30.0000 mg | ORAL_CAPSULE | Freq: Every day | ORAL | 0 refills | Status: DC

## 2020-05-26 NOTE — Telephone Encounter (Signed)
Last appointment 01/25/2020 telemedicine having used 3 dispensings of Adderall and Klonopin since then requiring additional August 2 and now again medically necessary with no contraindication in last year.

## 2020-05-26 NOTE — Telephone Encounter (Signed)
Last refill 04/24/20 pended for Dr. Marlyne Beards to review

## 2020-05-26 NOTE — Telephone Encounter (Signed)
Pt called requesting refill on Adderall XR called to [facility] on [address] in [place]. Next appt is 07/27/20

## 2020-06-22 ENCOUNTER — Telehealth: Payer: Self-pay | Admitting: Psychiatry

## 2020-06-22 DIAGNOSIS — F902 Attention-deficit hyperactivity disorder, combined type: Secondary | ICD-10-CM

## 2020-06-22 MED ORDER — AMPHETAMINE-DEXTROAMPHET ER 30 MG PO CP24: 30.0000 mg | ORAL_CAPSULE | Freq: Every day | ORAL | 0 refills | Status: DC

## 2020-06-22 NOTE — Telephone Encounter (Signed)
[place] registry documents last dispensing of Adderall 05/26/2020 patient reminding office to send next fill for 30 mg XR every morning to [facility] medically necessary no contraindication as per last year of registry and epic

## 2020-06-22 NOTE — Telephone Encounter (Signed)
Pt would like a refill on Adderall XR. Please send to [facility] in [place] on [address]

## 2020-07-13 ENCOUNTER — Encounter: Payer: Self-pay | Admitting: Psychiatry

## 2020-07-24 ENCOUNTER — Ambulatory Visit: Payer: 59 | Admitting: Psychiatry

## 2020-07-25 ENCOUNTER — Telehealth (INDEPENDENT_AMBULATORY_CARE_PROVIDER_SITE_OTHER): Payer: 59 | Admitting: Psychiatry

## 2020-07-25 ENCOUNTER — Encounter: Payer: Self-pay | Admitting: Psychiatry

## 2020-07-25 ENCOUNTER — Other Ambulatory Visit: Payer: Self-pay | Admitting: Psychiatry

## 2020-07-25 ENCOUNTER — Telehealth: Payer: Self-pay | Admitting: Psychiatry

## 2020-07-25 DIAGNOSIS — F411 Generalized anxiety disorder: Secondary | ICD-10-CM

## 2020-07-25 DIAGNOSIS — F410 Panic disorder [episodic paroxysmal anxiety] without agoraphobia: Secondary | ICD-10-CM | POA: Diagnosis not present

## 2020-07-25 DIAGNOSIS — F902 Attention-deficit hyperactivity disorder, combined type: Secondary | ICD-10-CM | POA: Diagnosis not present

## 2020-07-25 DIAGNOSIS — F3181 Bipolar II disorder: Secondary | ICD-10-CM

## 2020-07-25 MED ORDER — QUETIAPINE FUMARATE ER 200 MG PO TB24: 200.0000 mg | ORAL_TABLET | Freq: Every day | ORAL | 3 refills | Status: DC

## 2020-07-25 MED ORDER — CLONAZEPAM 0.5 MG PO TABS: 0.5000 mg | ORAL_TABLET | Freq: Two times a day (BID) | ORAL | 1 refills | Status: DC | PRN

## 2020-07-25 MED ORDER — LAMOTRIGINE 200 MG PO TABS: 200.0000 mg | ORAL_TABLET | Freq: Every day | ORAL | 3 refills | Status: DC

## 2020-07-25 MED ORDER — AMPHETAMINE-DEXTROAMPHET ER 30 MG PO CP24: 30.0000 mg | ORAL_CAPSULE | Freq: Every day | ORAL | 0 refills | Status: DC

## 2020-07-25 NOTE — Telephone Encounter (Signed)
Ms. dorianna, mckiver are scheduled for a virtual visit with your provider today.    Just as we do with appointments in the office, we must obtain your consent to participate.  Your consent will be active for this visit and any virtual visit you may have with one of our providers in the next 365 days.    If you have a MyChart account, I can also send a copy of this consent to you electronically.  All virtual visits are billed to your insurance company just like a traditional visit in the office.  As this is a virtual visit, video technology does not allow for your provider to perform a traditional examination.  This may limit your provider's ability to fully assess your condition.  If your provider identifies any concerns that need to be evaluated in person or the need to arrange testing such as labs, EKG, etc, we will make arrangements to do so.    Although advances in technology are sophisticated, we cannot ensure that it will always work on either your end or our end.  If the connection with a video visit is poor, we may have to switch to a telephone visit.  With either a video or telephone visit, we are not always able to ensure that we have a secure connection.   I need to obtain your verbal consent now.   Are you willing to proceed with your visit today?   KENZLI BARRITT has provided verbal consent on 07/25/2020 for a virtual visit (video or telephone).   Chauncey Mann, MD 07/25/2020  10:56 AM

## 2020-07-25 NOTE — Progress Notes (Addendum)
[facility]  Patient ID: Andrea Oliver,  MRN: [id number]  PCP: System, Provider Not In  Date of Evaluation: 07/25/2020 Time spent:20 minutes  from 1020 to [place] Complaint:  Chief Complaint    Depression; Manic Behavior; Anxiety; ADHD; Panic Attack; Eating Disorder      HISTORY/CURRENT STATUS: Andrea Oliver is provided telemedicine audiovisual appointment session on MyChart Video Visit platform with telehealth consent with epic collateral for psychiatric interview and exam in 48-monthevaluation and management of bipolar II with mixed features, panic disorder, generalized anxiety, and ADHD.  Patient reviews general medical exam from July 2 in the course of post gastric bypass 2015 reduction of weight of 100 pounds regaining on Seroquel now down 50 pounds again.  [place] registry documents last Klonopin 05/12/2020 as a 90-day supply and Adderall as a 30-day supply of 06/24/2020.  Patient expects her job to be permanently work from home.  She has no interim concerns from a mental health perspective wishing she could be more physically active on the job.  She continues Lamictal and Seroquel XR each 200 mg along with Klonopin  usually 2 a day all now from [facility]  [facility] is daily and tolerated [facility]  She has no mania, suicidality, psychosis or delirium.  Mother works with TDonnal Moatand patient has to help mother with application at home of her care, so that they request that THelene Kelpconsider seeing HMariamaas patient as well with my imminent retirement case closure today.   Depression The patient presents withdepression as a recurrentcyclicproblem of 15 years with the most recent episode starting more than 1 year ago. The onset quality is sudden. The problem occurs intermittently.The most recent episode lasted 6 weeks. The problem has been gradually improvingsince onset.Associated symptoms include decreased  concentration,emotional lability,prepanic vigilance, and decreased energy. Associated symptoms include no helplessness,no hopelessness, norestlessness,no anhedonia, and no suicidal ideas.The symptoms are aggravated by work stress, medication and social issues.Past treatments include other medications.Compliance with treatment is good.Past compliance problems include insurance issues and pharmacy issues.Previous treatment provided moderaterelief.Risk factors include family history of mental illness, family history, a change in medication usage/dosage, major life event, alcohol intake, history of self-injury, and substance use. Past medical history includes anxiety,bipolar disorder,depression,mental health disorderand head trauma. Pertinent negatives include no fibromyalgia,no thyroid problem,no recent illness,no physical disability,no terminal illness,no brain trauma,no obsessive-compulsive disorder,no post-traumatic stress disorder,no schizophreniaand no suicide attempts.  Individual Medical History/ Review of Systems: Changes? :Yes Annual general medical exam with CNanci Pina PA included the following labs patient having had her gastric bypass in 2015 losing 100 pounds regaining on Seroquel then losing again.  Comprehensive Metabolic Panel (012/87/86764:30 PM EDT) Comprehensive Metabolic Panel (072/09/47094:30 PM EDT)  Component Value Ref Range Performed At Pathologist Signature  Glucose 81 65 - 99 mg/dL [facility]   BUN 10 6 - 24 mg/dL [facility]   Creatinine 0.76 0.57 - 1.00 mg/dL [facility]   eGFR If NonAfrican American 97 >59 mL/min/1.73 [facility]   eGFR If African American 112 Comment:  **[facility] currently reports eGFR in compliance with the current** recommendations of the [employer] [facility] will update reporting as new guidelines are published from the NKF-ASN Task force.  >59 mL/min/1.73 [facility]   BUN/Creatinine Ratio 13  9 - 23 [facility]   Sodium 140 134 - 144 mmol/L [facility]   Potassium 4.0 3.5 - 5.2 mmol/L [facility]   Chloride 104 96 - 106 mmol/L  [facility]   CO2 24 20 - 29 mmol/L [facility]   CALCIUM 8.7 8.7 - 10.2 mg/dL [facility]   Total Protein 6.4 6.0 - 8.5 g/dL [facility]   Albumin, Serum 4.0 3.8 - 4.8 g/dL [facility]   Globulin, Total 2.4 1.5 - 4.5 g/dL [facility]   Albumin/Globulin Ratio 1.7 1.2 - 2.2 [facility]   Total Bilirubin 0.3 0.0 - 1.2 mg/dL [facility]   Alkaline Phosphatase 71 48 - 121 IU/L [facility]   AST 17 0 - 40 IU/L [facility]   ALT (SGPT) 14 0 - 32 IU/L [facility]       Lipid Panel (03/24/2020 4:30 PM EDT) Lipid Panel (03/24/2020 4:30 PM EDT)  Component Value Ref Range Performed At Pathologist Signature  Cholesterol, Total 164 100 - 199 mg/dL [facility]   Triglycerides 139 0 - 149 mg/dL [facility]   HDL 58 >39 mg/dL [facility]   VLDL Cholesterol Cal 24 5 - 40 mg/dL [facility]   LDL 82 0 - 99 mg/dL [facility]     Allergies: Aspirin and Nsaids  Current Medications:  Current Outpatient Medications:  .  amphetamine-dextroamphetamine (ADDERALL XR) 30 MG 24 hr capsule, Take 1 capsule (30 mg total) by mouth daily after breakfast., Disp: 30 capsule, Rfl: 0 .  [START ON 08/24/2020] amphetamine-dextroamphetamine (ADDERALL XR) 30 MG 24 hr capsule, Take 1 capsule (30 mg total) by mouth daily after breakfast., Disp: 30 capsule, Rfl: 0 .  [START ON 09/22/2020] amphetamine-dextroamphetamine (ADDERALL XR) 30 MG 24 hr capsule, Take 1 capsule (30 mg total) by mouth daily after breakfast., Disp: 30 capsule, Rfl: 0 .  clonazePAM (KLONOPIN) 0.5 MG tablet, Take 1 tablet (0.5 mg total) by mouth 2 (two) times daily as needed for anxiety., Disp: 180 tablet, Rfl: 1 .  lamoTRIgine (LAMICTAL) 200 MG tablet, Take 1 tablet (200 mg total) by mouth daily after breakfast., Disp: 90 tablet, Rfl: 3 .  QUEtiapine (SEROQUEL XR) 200 MG 24 hr tablet, Take 1 tablet (200 mg total) by mouth at bedtime., Disp:  90 tablet, Rfl: 3  Medication Side Effects: none  Family Medical/ Social History: Changes? Yes mother to whom patient provides support and containment has current care here with Donnal Moat, [place]:  There were no vitals taken for this visit.There is no height or weight on file to calculate BMI. Muscle strengths and tone 5/5, postural reflexes and gait 0/0, and AIMS = 0.  General Appearance: Casual, Fairly Groomed and Meticulous  Eye Contact:  Good  Speech:  Clear and Coherent, Normal Rate and Talkative  Volume:  Normal  Mood:  Anxious and Euthymic  Affect:  Congruent and Full Range  Thought Process:  Coherent, Goal Directed, Irrelevant, Linear and Descriptions of Associations: Tangential  Orientation:  Full (Time, Place, and Person)  Thought Content: Rumination and Tangential   Suicidal Thoughts:  No  Homicidal Thoughts:  No  Memory:  Immediate;   Good Remote;   Good  Judgement:  Good  Insight:  Fair  Psychomotor Activity:  Normal and Mannerisms  Concentration:  Concentration: Fair and Attention Span: Fair  Recall:  [employer] of Knowledge: Good  Language: Good  Assets:  Desire for Improvement Intimacy Leisure Time Social Support Vocational/Educational  ADL's:  Intact  Cognition: WNL  Prognosis:  Good    DIAGNOSES:    ICD-10-CM   1. Bipolar II disorder, mild, depressed, with mixed features, in full remission ([place])  F31.81 lamoTRIgine (LAMICTAL) 200 MG tablet    QUEtiapine (SEROQUEL XR) 200 MG 24 hr tablet    clonazePAM (KLONOPIN) 0.5 MG tablet  2. Generalized anxiety disorder  F41.1 QUEtiapine (SEROQUEL XR) 200 MG 24 hr tablet    clonazePAM (KLONOPIN) 0.5 MG tablet  3. Panic disorder  F41.0   4. Attention deficit hyperactivity disorder (ADHD), combined type, mild  F90.2 amphetamine-dextroamphetamine (ADDERALL XR) 30 MG 24 hr capsule    amphetamine-dextroamphetamine (ADDERALL XR) 30 MG 24 hr capsule    amphetamine-dextroamphetamine (ADDERALL XR) 30  MG 24 hr capsule    Receiving Psychotherapy: No    RECOMMENDATIONS: Patient organizes much of her clinical perspective around her barometric accomplishments for weight loss initially 100 pounds with gastric bypass followed by regaining some on Seroquel but then losing another 50 pounds.  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  She requires Adderall every morning for function on the job while as needed Klonopin for predominantly generalized anxiety with episodic limited symptom panic now.  She has not needed Luvox or Cymbalta.  She is E scribed Lamictal 200 mg IR tablet every morning after breakfast sent as #90 with 3 refills to [facility] for bipolar disorder.  She is E scribed Seroquel 200 mg XR tablet nightly at bedtime sent as #90 with 3 refills to [facility] for bipolar and generalized and panic anxiety disorders.  She is E scribed Klonopin 0.5 mg twice daily as needed for panic or anxiety sent as #180 with 1 refill to [facility] for panic and generalized anxiety.  She is E scribed Adderall 30 mg XR every morning sent as #30 each for November 2, December 2, and December 31 to [facility] in [place] for ADHD.  She will follow-up with Melony Overly, PA-C in 6 months or sooner if needed and can recommend refills monthly for the fourth, fifth and sixth months of the interim to next appointment be provided by the office to the pharmacy and closure of my care updating prevention and monitoring safety hygiene.  Virtual Visit via Video Note  I connected with DEMETRICE AMSTUTZ on 07/26/20 at 10:20 AM EDT by a video enabled telemedicine application and verified that I am speaking with the correct person using two identifiers.  Location: Patient: Individually with privacy at her employment work desk in family residence video to  video Provider: [facility]   I discussed the limitations of evaluation and management by telemedicine and the availability of in person appointments. The patient expressed understanding and agreed to proceed.  History of Present Illness: 9-month evaluation and management address bipolar II with mixed features, panic disorder, generalized anxiety, and ADHD.  Patient reviews general medical exam from July 2 in the course of post gastric bypass 2015 reduction of weight of 100 pounds regaining on Seroquel now down 50 pounds again.  [place] registry documents last Klonopin 05/12/2020 as a 90-day supply and Adderall as a 30-day supply of 06/24/2020.   Observations/Objective: Mood:  Anxious and Euthymic  Affect:  Congruent and Full Range  Thought Process:  Coherent, Goal Directed, Irrelevant, Linear and Descriptions of Associations: Tangential  Orientation:  Full (Time, Place, and Person)  Thought Content: Rumination and Tangential    Assessment and Plan:  Patient organizes much of her clinical perspective around her barometric accomplishments  for weight loss initially 100 pounds with gastric bypass followed by regaining some on Seroquel but then losing another 50 pounds.  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  She requires Adderall every morning for function on the job while as needed Klonopin for predominantly generalized anxiety with episodic limited symptom panic now.  She has not needed Luvox or Cymbalta.  She is E scribed Lamictal 200 mg IR tablet every morning after breakfast sent as #90 with 3 refills to [facility] for bipolar disorder.  She is E scribed Seroquel 200 mg XR tablet nightly at bedtime sent as #90 with 3 refills to [facility] for bipolar and generalized and panic anxiety disorders.  She is E scribed  Klonopin 0.5 mg twice daily as needed for panic or anxiety sent as #180 with 1 refill to [facility] for panic and generalized anxiety.  She is E scribed Adderall 30 mg XR every morning sent as #30 each for November 2, December 2, and December 31 to [facility] in [place] on [place] for ADHD.  Follow Up Instructions: She will follow-up with Donnal Moat, PA-C in 6 months or sooner if needed and can recommend refills monthly for the fourth, fifth and sixth months of the interim to next appointment be provided by the office to the pharmacy and closure of my care updating prevention and monitoring safety hygiene.    I discussed the assessment and treatment plan with the patient. The patient was provided an opportunity to ask questions and all were answered. The patient agreed with the plan and demonstrated an understanding of the instructions.   The patient was advised to call back or seek an in-person evaluation if the symptoms worsen or if the condition fails to improve as anticipated.  I provided 20 minutes of non-face-to-face time during this encounter.   Delight Hoh, MD  Delight Hoh, MD

## 2020-07-27 ENCOUNTER — Ambulatory Visit: Payer: 59 | Admitting: Psychiatry

## 2020-10-23 ENCOUNTER — Other Ambulatory Visit: Payer: Self-pay | Admitting: Physician Assistant

## 2020-10-23 ENCOUNTER — Telehealth: Payer: Self-pay | Admitting: Physician Assistant

## 2020-10-23 DIAGNOSIS — F902 Attention-deficit hyperactivity disorder, combined type: Secondary | ICD-10-CM

## 2020-10-23 MED ORDER — AMPHETAMINE-DEXTROAMPHET ER 30 MG PO CP24: 30.0000 mg | ORAL_CAPSULE | Freq: Every day | ORAL | 0 refills | Status: DC

## 2020-10-23 NOTE — Telephone Encounter (Signed)
3 Rxs sent to pharmacy above.

## 2020-10-23 NOTE — Telephone Encounter (Signed)
Next appt is 01/26/21. Requesting refill on Adderall 30 mg called to [facility] on [address], [place], [place]

## 2021-01-22 ENCOUNTER — Telehealth: Payer: Self-pay | Admitting: Physician Assistant

## 2021-01-22 NOTE — Telephone Encounter (Signed)
Please review

## 2021-01-22 NOTE — Telephone Encounter (Signed)
Lutie called in with refill request for Adderal XR 30mg . Former pt of [facility]. Appt 5/6 with TH. Ph [phone]. [facility] [phone] [place]

## 2021-01-23 ENCOUNTER — Other Ambulatory Visit: Payer: Self-pay | Admitting: Physician Assistant

## 2021-01-23 DIAGNOSIS — F902 Attention-deficit hyperactivity disorder, combined type: Secondary | ICD-10-CM

## 2021-01-23 MED ORDER — AMPHETAMINE-DEXTROAMPHET ER 30 MG PO CP24: 30.0000 mg | ORAL_CAPSULE | Freq: Every day | ORAL | 0 refills | Status: DC

## 2021-01-23 NOTE — Telephone Encounter (Signed)
Prescription was sent

## 2021-01-26 ENCOUNTER — Encounter: Payer: Self-pay | Admitting: Physician Assistant

## 2021-01-26 ENCOUNTER — Other Ambulatory Visit: Payer: Self-pay

## 2021-01-26 ENCOUNTER — Ambulatory Visit (INDEPENDENT_AMBULATORY_CARE_PROVIDER_SITE_OTHER): Payer: 59 | Admitting: Physician Assistant

## 2021-01-26 ENCOUNTER — Ambulatory Visit: Payer: 59 | Admitting: Physician Assistant

## 2021-01-26 DIAGNOSIS — F411 Generalized anxiety disorder: Secondary | ICD-10-CM | POA: Diagnosis not present

## 2021-01-26 DIAGNOSIS — F3181 Bipolar II disorder: Secondary | ICD-10-CM

## 2021-01-26 MED ORDER — AMPHETAMINE-DEXTROAMPHET ER 20 MG PO CP24: 40.0000 mg | ORAL_CAPSULE | Freq: Every day | ORAL | 0 refills | Status: DC

## 2021-01-26 MED ORDER — CLONAZEPAM 0.5 MG PO TABS: 0.5000 mg | ORAL_TABLET | Freq: Two times a day (BID) | ORAL | 1 refills | Status: DC | PRN

## 2021-01-26 NOTE — Progress Notes (Signed)
[facility]  Patient ID: Andrea Oliver,  MRN: [id number]  PCP: System, Provider Not In  Date of Evaluation: 01/26/2021 Time spent:45 minutes  Chief Complaint:  Chief Complaint    ADD; Anxiety       HISTORY/CURRENT STATUS: HPI For routine med check.  Transferring to my care from Dr. Beverly Oliver who recently retired.  She feels like the Adderall isn't working as well as it did.  Has been on same dose for about 2 years.  Gets distracted easily and things take longer to complete than they used to.  She works from home which is a good and bad thing, difficult have boundaries for work time versus home time.  Wonders if we can change anything.  Andrea Oliver has a history of bipolar II, recurrent cyclical depression for the past 15+ years.  Currently on Lamictal and Seroquel which are helping.  She is able to enjoy things, energy and motivation are fair to good depending on the day.  She sleeps well.  Feels rested when she gets up.  Does not cry easily.  Appetite and weight are stable.  Denies suicidal or homicidal thoughts.  Continues to have anxiety almost daily.  She takes Klonopin for that and has been for a long time, prescribed by Dr. Marlyne Oliver.  Feels that it is effective.  Does not have panic episodes except occasionally.  It is more generalized anxiety, feeling on edge like something bad is going to happen.  Patient denies increased energy with decreased need for sleep, no increased talkativeness, no racing thoughts, no impulsivity or risky behaviors, no increased spending, no increased libido, no grandiosity, no increased irritability or anger, and no hallucinations.  Denies dizziness, syncope, seizures, numbness, tingling, tremor, tics, unsteady gait, slurred speech, confusion. Denies muscle or joint pain, stiffness, or dystonia.  Individual Medical History/ Review of Systems: Changes? :No   Past medications for mental health diagnoses include: Lamictal, Seroquel,  Klonopin, Adderall, Xanax, Prozac, Lexapro, melatonin, trazodone  Allergies: Aspirin and Nsaids  Current Medications:  Current Outpatient Medications:  .  [START ON 02/22/2021] amphetamine-dextroamphetamine (ADDERALL XR) 20 MG 24 hr capsule, Take 2 capsules (40 mg total) by mouth daily after breakfast., Disp: 60 capsule, Rfl: 0 .  lamoTRIgine (LAMICTAL) 200 MG tablet, Take 1 tablet (200 mg total) by mouth daily after breakfast., Disp: 90 tablet, Rfl: 3 .  Multiple Vitamin (MULTIVITAMIN) tablet, Take 1 tablet by mouth daily., Disp: , Rfl:  .  omeprazole (PRILOSEC) 40 MG capsule, Take 40 mg by mouth daily., Disp: , Rfl:  .  QUEtiapine (SEROQUEL XR) 200 MG 24 hr tablet, Take 1 tablet (200 mg total) by mouth at bedtime., Disp: 90 tablet, Rfl: 3 .  clonazePAM (KLONOPIN) 0.5 MG tablet, Take 1 tablet (0.5 mg total) by mouth 2 (two) times daily as needed for anxiety., Disp: 180 tablet, Rfl: 1 Medication Side Effects: none  Family Medical/ Social History: Changes? promoted at work, Andrea Oliver.  MENTAL HEALTH EXAM:  Blood pressure 129/81, pulse 85, height 5\' 5"  (1.651 m), weight 200 lb (90.7 kg).Body mass index is 33.28 kg/m.  General Appearance: Casual, Well Groomed and Obese  Eye Contact:  Good  Speech:  Clear and Coherent and Normal Rate  Volume:  Normal  Mood:  Euthymic  Affect:  Appropriate  Thought Process:  Goal Directed and Descriptions of Associations: Circumstantial  Orientation:  Full (Time, Place, and Person)  Thought Content: Logical   Suicidal Thoughts:  No  Homicidal Thoughts:  No  Memory:  WNL  Judgement:  Good  Insight:  Good  Psychomotor Activity:  Normal  Concentration:  Concentration: Fair  Recall:  Good  Fund of Knowledge: Good  Language: Good  Assets:  Desire for Improvement  ADL's:  Intact  Cognition: WNL  Prognosis:  Good    DIAGNOSES:    ICD-10-CM   1. Bipolar II disorder, mild, depressed, with mixed features, in full remission ([facility])  F31.81 clonazePAM  (KLONOPIN) 0.5 MG tablet  2. Generalized anxiety disorder  F41.1 clonazePAM (KLONOPIN) 0.5 MG tablet    Receiving Psychotherapy: No    RECOMMENDATIONS:  PDMP reviewed.  I provided 45 mins of face to face time during this visit, including time spent in medical decision making, review of records, and charting.  For the ADHD I recommend increasing the Adderall.  She would like to try it. Increase Adderall XR to 20 mg, 2 p.o. every morning after breakfast. Continue Klonopin 0.5 mg, 1 p.o. twice daily as needed.  She has been on the combination of Adderall and Klonopin for a while and I am not going to change that at this point.  If possible in the future maybe we can choose a noncontrolled substance for either the ADHD or anxiety. Continue Lamictal 200 mg, 1 p.o. every morning. Continue Seroquel XR 200 mg, 1 p.o. nightly. Call in 3 weeks from now if ins won't pay for 40 mg- or if they do, call in about 6-7 weeks from now let me know how it's doing and will Rx from there.  At that point I can send XR 20 mg 2 qd. Or XR 30 + XR 10 or even change to an IR in afternoon.  She verbalizes understanding. Return in 6 months.  Andrea Overly, PA-C

## 2021-02-21 ENCOUNTER — Telehealth: Payer: Self-pay | Admitting: Physician Assistant

## 2021-02-21 NOTE — Telephone Encounter (Signed)
Medication Prior Authorization Form received. Place on Traci's desk 06/01

## 2021-03-22 ENCOUNTER — Telehealth: Payer: Self-pay

## 2021-03-22 ENCOUNTER — Other Ambulatory Visit: Payer: Self-pay

## 2021-03-22 ENCOUNTER — Other Ambulatory Visit: Payer: Self-pay | Admitting: Physician Assistant

## 2021-03-22 ENCOUNTER — Telehealth: Payer: Self-pay | Admitting: Physician Assistant

## 2021-03-22 MED ORDER — AMPHETAMINE-DEXTROAMPHET ER 20 MG PO CP24: 40.0000 mg | ORAL_CAPSULE | Freq: Every day | ORAL | 0 refills | Status: DC

## 2021-03-22 NOTE — Telephone Encounter (Signed)
I sent a PA so we will see what happens

## 2021-03-22 NOTE — Telephone Encounter (Signed)
Pt would like a refill on Adderall Xr. Please send to [facility] on [address] in [place] .

## 2021-03-22 NOTE — Telephone Encounter (Signed)
Please refuse. duplicate

## 2021-03-22 NOTE — Telephone Encounter (Signed)
Prior Authorization initiated for AMPHETAMINE-DEXTROAMPHETAMINE XR 20 MG #60 with [facility]. Pending response at this time

## 2021-03-22 NOTE — Telephone Encounter (Signed)
Pended.

## 2021-03-22 NOTE — Telephone Encounter (Signed)
Andrea Oliver  is this going to be impossible to get covered?  Patient and I had discussed either doing an XR 30 mg plus an XR 10 mg so if it is easier to do that let me know.

## 2021-03-23 NOTE — Telephone Encounter (Signed)
Noted  

## 2021-03-23 NOTE — Telephone Encounter (Signed)
Prior approval received effective 03/22/2021-03/23/2022

## 2021-03-23 NOTE — Telephone Encounter (Signed)
Approval received effective 03/22/2021-03/23/2021 for AMPHETAMINE-DEXTROAMPHETAMINE ER 20 MG #60 with [facility]

## 2021-04-23 ENCOUNTER — Other Ambulatory Visit: Payer: Self-pay

## 2021-04-23 ENCOUNTER — Telehealth: Payer: Self-pay | Admitting: Physician Assistant

## 2021-04-23 NOTE — Telephone Encounter (Signed)
Pended.

## 2021-04-23 NOTE — Telephone Encounter (Signed)
Pt would like a refill on Adderall xr. Please send to [facility] in [place] on [address]

## 2021-04-24 MED ORDER — AMPHETAMINE-DEXTROAMPHET ER 20 MG PO CP24: ORAL_CAPSULE | ORAL | 0 refills | Status: DC

## 2021-05-23 ENCOUNTER — Telehealth: Payer: Self-pay | Admitting: Physician Assistant

## 2021-05-23 ENCOUNTER — Other Ambulatory Visit: Payer: Self-pay

## 2021-05-23 MED ORDER — AMPHETAMINE-DEXTROAMPHET ER 20 MG PO CP24: ORAL_CAPSULE | ORAL | 0 refills | Status: DC

## 2021-05-23 NOTE — Telephone Encounter (Signed)
Pended.

## 2021-05-23 NOTE — Telephone Encounter (Signed)
Next visit is 08/03/21. Requesting refill on Adderall XR 20 mg called to:  [facility] [address] [place] - [address]  Phone:  [phone]  Fax:  [phone]

## 2021-06-04 ENCOUNTER — Telehealth: Payer: Self-pay | Admitting: Physician Assistant

## 2021-06-04 ENCOUNTER — Other Ambulatory Visit: Payer: Self-pay | Admitting: Physician Assistant

## 2021-06-04 DIAGNOSIS — F902 Attention-deficit hyperactivity disorder, combined type: Secondary | ICD-10-CM

## 2021-06-04 NOTE — Telephone Encounter (Signed)
Rx cancelled please review

## 2021-06-04 NOTE — Telephone Encounter (Signed)
Andrea Oliver called stating the Adderall is not available at her pharmacy on file. She states the [facility] on [address] in [place] has 10 and 30 mg. She currently takes 40. She is inquiring if you can modify the directions and send there. Patient states she has been without for a few days.

## 2021-06-05 ENCOUNTER — Other Ambulatory Visit: Payer: Self-pay | Admitting: Physician Assistant

## 2021-06-05 MED ORDER — AMPHETAMINE-DEXTROAMPHET ER 10 MG PO CP24: 10.0000 mg | ORAL_CAPSULE | Freq: Every day | ORAL | 0 refills | Status: DC

## 2021-06-05 MED ORDER — AMPHETAMINE-DEXTROAMPHET ER 30 MG PO CP24: 30.0000 mg | ORAL_CAPSULE | Freq: Every day | ORAL | 0 refills | Status: DC

## 2021-06-05 NOTE — Telephone Encounter (Signed)
Rx for XR 10 mg and XR 30 mg were sent

## 2021-06-05 NOTE — Telephone Encounter (Signed)
Please refuse

## 2021-07-02 ENCOUNTER — Telehealth: Payer: Self-pay | Admitting: Physician Assistant

## 2021-07-02 NOTE — Telephone Encounter (Signed)
Filled 9/13 not due until 10/12

## 2021-07-02 NOTE — Telephone Encounter (Signed)
Andrea Oliver called to request refill of her Adderall.  Appt 11/11.  Please send to [facility] on [address] in [place], [place].  Please send prescription for 30mg  and 10mg  as you did last time.

## 2021-07-04 ENCOUNTER — Other Ambulatory Visit: Payer: Self-pay

## 2021-07-04 MED ORDER — AMPHETAMINE-DEXTROAMPHET ER 30 MG PO CP24: 30.0000 mg | ORAL_CAPSULE | Freq: Every day | ORAL | 0 refills | Status: DC

## 2021-07-04 MED ORDER — AMPHETAMINE-DEXTROAMPHET ER 10 MG PO CP24: 10.0000 mg | ORAL_CAPSULE | Freq: Every day | ORAL | 0 refills | Status: DC

## 2021-07-04 NOTE — Telephone Encounter (Signed)
pended

## 2021-08-03 ENCOUNTER — Ambulatory Visit: Payer: 59 | Admitting: Physician Assistant

## 2021-08-06 ENCOUNTER — Telehealth: Payer: Self-pay | Admitting: Physician Assistant

## 2021-08-06 ENCOUNTER — Other Ambulatory Visit: Payer: Self-pay

## 2021-08-06 MED ORDER — AMPHETAMINE-DEXTROAMPHET ER 10 MG PO CP24: 10.0000 mg | ORAL_CAPSULE | Freq: Every day | ORAL | 0 refills | Status: DC

## 2021-08-06 MED ORDER — AMPHETAMINE-DEXTROAMPHET ER 30 MG PO CP24: 30.0000 mg | ORAL_CAPSULE | Freq: Every day | ORAL | 0 refills | Status: DC

## 2021-08-06 NOTE — Telephone Encounter (Signed)
Pended.

## 2021-08-06 NOTE — Telephone Encounter (Signed)
Andrea Oliver called to request refill of her Adderall., both strenghts. Appt 11/30.  Send to [facility] on [address] in [place], [place].  They say they have it.

## 2021-08-08 ENCOUNTER — Other Ambulatory Visit: Payer: Self-pay | Admitting: Psychiatry

## 2021-08-08 DIAGNOSIS — F411 Generalized anxiety disorder: Secondary | ICD-10-CM

## 2021-08-08 DIAGNOSIS — F3181 Bipolar II disorder: Secondary | ICD-10-CM

## 2021-08-22 ENCOUNTER — Telehealth (INDEPENDENT_AMBULATORY_CARE_PROVIDER_SITE_OTHER): Payer: 59 | Admitting: Physician Assistant

## 2021-08-22 ENCOUNTER — Encounter: Payer: Self-pay | Admitting: Physician Assistant

## 2021-08-22 DIAGNOSIS — F902 Attention-deficit hyperactivity disorder, combined type: Secondary | ICD-10-CM | POA: Diagnosis not present

## 2021-08-22 DIAGNOSIS — F411 Generalized anxiety disorder: Secondary | ICD-10-CM | POA: Diagnosis not present

## 2021-08-22 DIAGNOSIS — F3181 Bipolar II disorder: Secondary | ICD-10-CM | POA: Diagnosis not present

## 2021-08-22 MED ORDER — AMPHETAMINE-DEXTROAMPHET ER 30 MG PO CP24: 30.0000 mg | ORAL_CAPSULE | Freq: Every day | ORAL | 0 refills | Status: DC

## 2021-08-22 MED ORDER — CLONAZEPAM 0.5 MG PO TABS: 0.5000 mg | ORAL_TABLET | Freq: Two times a day (BID) | ORAL | 1 refills | Status: DC | PRN

## 2021-08-22 MED ORDER — AMPHETAMINE-DEXTROAMPHET ER 10 MG PO CP24: 10.0000 mg | ORAL_CAPSULE | Freq: Every day | ORAL | 0 refills | Status: DC

## 2021-08-22 MED ORDER — AMPHETAMINE-DEXTROAMPHET ER 30 MG PO CP24
30.0000 mg | ORAL_CAPSULE | Freq: Every day | ORAL | 0 refills | Status: DC
Start: 2021-09-04 — End: 2022-02-21

## 2021-08-22 NOTE — Progress Notes (Signed)
[facility]  Patient ID: Andrea Oliver,  MRN: [id number]  PCP: System, Provider Not In  Date of Evaluation: 08/22/2021 Time spent:30 minutes  Chief Complaint:  Chief Complaint   Anxiety; ADD    Virtual Visit via Telehealth  I connected with patient by a video enabled telemedicine application with their informed consent, and verified patient privacy and that I am speaking with the correct person using two identifiers.  I am private, in my office and the patient is at work.  I discussed the limitations, risks, security and privacy concerns of performing an evaluation and management service by telephone video and the availability of in person appointments. I also discussed with the patient that there may be a patient responsible charge related to this service. The patient expressed understanding and agreed to proceed.   I discussed the assessment and treatment plan with the patient. The patient was provided an opportunity to ask questions and all were answered. The patient agreed with the plan and demonstrated an understanding of the instructions.   The patient was advised to call back or seek an in-person evaluation if the symptoms worsen or if the condition fails to improve as anticipated.  I provided 30 minutes of non-face-to-face time during this encounter.    HISTORY/CURRENT STATUS: HPI For routine med check.  Former patient of Dr. Beverly Milch.  She is doing well with current medications.  We had to change the Adderall XR 20 mg to 30 mg plus a 10 mg simply because of the [employer].  The dose itself was not changed.  States it is still working well.  She is able to focus and get things done in a timely manner.  Does not get distracted as easily as she did before taking the Adderall.  Work is going well and productivity is good.  She is able to enjoy things, energy and motivation are fair to good depending on the day.  She sleeps well most of the time.  She  falls asleep within 30 minutes of taking the Seroquel every night.  Some mornings she feels a little groggy when she first gets up but it passes quickly.  Once she takes the Adderall, she is awake and alert and ready to face the day.  Does not cry easily.  Appetite and weight are stable.  Denies suicidal or homicidal thoughts.  Continues to have anxiety almost daily.  She takes Klonopin for that and has been for a long time, prescribed by Dr. Marlyne Beards.  Feels that it is effective.  Does not have panic episodes except occasionally.  It is more generalized anxiety, feeling on edge like something bad is going to happen.  Patient denies increased energy with decreased need for sleep, no increased talkativeness, no racing thoughts, no impulsivity or risky behaviors, no increased spending, no increased libido, no grandiosity, no increased irritability or anger, and no hallucinations.  Denies dizziness, syncope, seizures, numbness, tingling, tremor, tics, unsteady gait, slurred speech, confusion. Denies muscle or joint pain, stiffness, or dystonia. Denies unexplained weight loss, frequent infections, or sores that heal slowly.  No polyphagia, polydipsia, or polyuria. Denies visual changes or paresthesias.   Individual Medical History/ Review of Systems: Changes? :No   Past medications for mental health diagnoses include: Lamictal, Seroquel, Klonopin, Adderall, Xanax, Prozac, Lexapro, melatonin, trazodone  Allergies: Aspirin and Nsaids  Current Medications:  Current Outpatient Medications:    [START ON 11/03/2021] amphetamine-dextroamphetamine (ADDERALL XR) 10 MG 24 hr capsule, Take 1 capsule (10 mg  total) by mouth daily., Disp: 30 capsule, Rfl: 0   [START ON 10/04/2021] amphetamine-dextroamphetamine (ADDERALL XR) 10 MG 24 hr capsule, Take 1 capsule (10 mg total) by mouth daily., Disp: 30 capsule, Rfl: 0   [START ON 09/04/2021] amphetamine-dextroamphetamine (ADDERALL XR) 10 MG 24 hr capsule, Take 1 capsule  (10 mg total) by mouth daily., Disp: 30 capsule, Rfl: 0   amphetamine-dextroamphetamine (ADDERALL XR) 30 MG 24 hr capsule, Take 1 capsule (30 mg total) by mouth daily., Disp: 30 capsule, Rfl: 0   [START ON 11/03/2021] amphetamine-dextroamphetamine (ADDERALL XR) 30 MG 24 hr capsule, Take 1 capsule (30 mg total) by mouth daily., Disp: 30 capsule, Rfl: 0   [START ON 10/04/2021] amphetamine-dextroamphetamine (ADDERALL XR) 30 MG 24 hr capsule, Take 1 capsule (30 mg total) by mouth daily., Disp: 30 capsule, Rfl: 0   [START ON 09/04/2021] amphetamine-dextroamphetamine (ADDERALL XR) 30 MG 24 hr capsule, Take 1 capsule (30 mg total) by mouth daily., Disp: 30 capsule, Rfl: 0   lamoTRIgine (LAMICTAL) 200 MG tablet, TAKE 1 TABLET DAILY AFTER BREAKFAST, Disp: 90 tablet, Rfl: 0   Multiple Vitamin (MULTIVITAMIN) tablet, Take 1 tablet by mouth daily., Disp: , Rfl:    omeprazole (PRILOSEC) 40 MG capsule, Take 40 mg by mouth daily., Disp: , Rfl:    QUEtiapine (SEROQUEL XR) 200 MG 24 hr tablet, TAKE 1 TABLET AT BEDTIME, Disp: 90 tablet, Rfl: 0   clonazePAM (KLONOPIN) 0.5 MG tablet, Take 1 tablet (0.5 mg total) by mouth 2 (two) times daily as needed for anxiety., Disp: 180 tablet, Rfl: 1 Medication Side Effects: none  Family Medical/ Social History: Changes? promoted at work, [employer].  MENTAL HEALTH EXAM:  There were no vitals taken for this visit.There is no height or weight on file to calculate BMI.  General Appearance: Casual, Well Groomed and Obese  Eye Contact:  Good  Speech:  Clear and Coherent and Normal Rate  Volume:  Normal  Mood:  Euthymic  Affect:  Appropriate  Thought Process:  Goal Directed and Descriptions of Associations: Circumstantial  Orientation:  Full (Time, Place, and Person)  Thought Content: Logical   Suicidal Thoughts:  No  Homicidal Thoughts:  No  Memory:  WNL  Judgement:  Good  Insight:  Good  Psychomotor Activity:  Normal  Concentration:  Concentration: Good and Attention  Span: Good  Recall:  Good  Fund of Knowledge: Good  Language: Good  Assets:  Desire for Improvement  ADL's:  Intact  Cognition: WNL  Prognosis:  Good    DIAGNOSES:    ICD-10-CM   1. Bipolar II disorder, mild, depressed, with mixed features, in full remission (HCC)  F31.81 clonazePAM (KLONOPIN) 0.5 MG tablet    2. Attention deficit hyperactivity disorder (ADHD), combined type, mild  F90.2     3. Generalized anxiety disorder  F41.1 clonazePAM (KLONOPIN) 0.5 MG tablet       Receiving Psychotherapy: No    RECOMMENDATIONS:  PDMP reviewed.  Last Adderall filled 08/06/2021. I provided 30 minutes of non-face-to-face time during this encounter, including time spent before and after the visit in records review, medical decision making, counseling pertinent to today's visit, and charting.  She is doing well on current medications so no changes will be made. No recent labs available to me.  We will discuss at next visit. Continue Adderall XR 30 mg +10 mg daily. Continue Klonopin 0.5 mg, 1 p.o. twice daily as needed.  She has been on the combination of Adderall and Klonopin for a while  and I am not going to change that at this point.  If possible in the future maybe we can choose a noncontrolled substance for either the ADHD or anxiety. Continue Lamictal 200 mg, 1 p.o. every morning. Continue Seroquel XR 200 mg, 1 p.o. nightly. Return in 6 months.  Melony Overly, PA-C

## 2021-10-18 ENCOUNTER — Other Ambulatory Visit: Payer: Self-pay | Admitting: Physician Assistant

## 2021-10-18 ENCOUNTER — Telehealth: Payer: Self-pay | Admitting: Physician Assistant

## 2021-10-18 MED ORDER — ADZENYS XR-ODT 12.5 MG PO TBED: 12.5000 mg | EXTENDED_RELEASE_TABLET | Freq: Two times a day (BID) | ORAL | 0 refills | Status: DC

## 2021-10-18 NOTE — Telephone Encounter (Signed)
Pt called and said that she is having a hard time finding her adderall at any pharmacies near her. She wants to know if she can switch to something comparible to adderall. She can't use vyvanse because it is not generic, Her insurance will cover generic concerta. Please call her at [phone]

## 2021-10-18 NOTE — Telephone Encounter (Signed)
She would like to try it

## 2021-10-18 NOTE — Telephone Encounter (Signed)
No problem. I'll show you the comparison chart, it makes more sense to me when I see something. I just don't want her to think she's getting a lower dose, but it's the same total daily dose. Thanks.

## 2021-10-18 NOTE — Telephone Encounter (Signed)
I sent it in to [facility].  If you did not mention this when you called her earlier, please make sure she understands the dosing is very different than what she is used to with the Adderall.  The equivalent of what she is taking now is 12.5 mg twice a day.  She should call the pharmacy to give all of her demographic info and decide whether to pick up or have it delivered.  Thank you.

## 2021-10-18 NOTE — Telephone Encounter (Signed)
I did let her know to call them ,but did not know about the dosing sorry.I will make note of that and inform her as well

## 2021-10-18 NOTE — Telephone Encounter (Signed)
Please review

## 2021-10-18 NOTE — Telephone Encounter (Signed)
Recommend Adzenys, [employer] $35 per month is what I've been quoted. If you have questions about that before calling her, please let me know, I will start Rx this often.

## 2021-10-18 NOTE — Telephone Encounter (Signed)
LVM to rtc 

## 2021-10-22 ENCOUNTER — Other Ambulatory Visit: Payer: Self-pay | Admitting: Physician Assistant

## 2021-10-22 DIAGNOSIS — F3181 Bipolar II disorder: Secondary | ICD-10-CM

## 2021-10-31 ENCOUNTER — Other Ambulatory Visit: Payer: Self-pay | Admitting: Physician Assistant

## 2021-10-31 DIAGNOSIS — F411 Generalized anxiety disorder: Secondary | ICD-10-CM

## 2021-10-31 DIAGNOSIS — F3181 Bipolar II disorder: Secondary | ICD-10-CM

## 2021-12-10 ENCOUNTER — Other Ambulatory Visit: Payer: Self-pay

## 2021-12-10 ENCOUNTER — Telehealth: Payer: Self-pay | Admitting: Physician Assistant

## 2021-12-10 MED ORDER — ADZENYS XR-ODT 12.5 MG PO TBED: 12.5000 mg | EXTENDED_RELEASE_TABLET | Freq: Two times a day (BID) | ORAL | 0 refills | Status: DC

## 2021-12-10 NOTE — Telephone Encounter (Signed)
Pended.

## 2021-12-10 NOTE — Telephone Encounter (Signed)
Andrea Oliver called this morning requesting refill of Adzenys.  Appt 6/1.  Send to [facility] ?

## 2022-01-18 ENCOUNTER — Other Ambulatory Visit: Payer: Self-pay | Admitting: Physician Assistant

## 2022-01-18 ENCOUNTER — Telehealth: Payer: Self-pay | Admitting: Physician Assistant

## 2022-01-18 MED ORDER — ADZENYS XR-ODT 12.5 MG PO TBED: 12.5000 mg | EXTENDED_RELEASE_TABLET | Freq: Two times a day (BID) | ORAL | 0 refills | Status: DC

## 2022-01-18 NOTE — Telephone Encounter (Signed)
Pt called at 9:40 am and asked for a refill of her adzenys xr 12.5 mg. Pharmacy is is [facility] on [address] ?

## 2022-01-18 NOTE — Telephone Encounter (Signed)
Prescription was sent

## 2022-01-27 ENCOUNTER — Telehealth: Payer: Self-pay

## 2022-01-28 NOTE — Telephone Encounter (Deleted)
Prior Authorization Initiated Adzenys 12.5 mg BID [facility]

## 2022-01-30 ENCOUNTER — Other Ambulatory Visit: Payer: Self-pay | Admitting: Physician Assistant

## 2022-01-30 DIAGNOSIS — F411 Generalized anxiety disorder: Secondary | ICD-10-CM

## 2022-01-30 DIAGNOSIS — F3181 Bipolar II disorder: Secondary | ICD-10-CM

## 2022-02-21 ENCOUNTER — Encounter: Payer: Self-pay | Admitting: Physician Assistant

## 2022-02-21 ENCOUNTER — Telehealth (INDEPENDENT_AMBULATORY_CARE_PROVIDER_SITE_OTHER): Payer: 59 | Admitting: Physician Assistant

## 2022-02-21 DIAGNOSIS — F3181 Bipolar II disorder: Secondary | ICD-10-CM

## 2022-02-21 DIAGNOSIS — F902 Attention-deficit hyperactivity disorder, combined type: Secondary | ICD-10-CM

## 2022-02-21 DIAGNOSIS — F411 Generalized anxiety disorder: Secondary | ICD-10-CM | POA: Diagnosis not present

## 2022-02-21 MED ORDER — ADZENYS XR-ODT 12.5 MG PO TBED: 12.5000 mg | EXTENDED_RELEASE_TABLET | Freq: Two times a day (BID) | ORAL | 0 refills | Status: DC

## 2022-02-21 NOTE — Progress Notes (Signed)
[facility]  Patient ID: Andrea Oliver,  MRN: [id number]  PCP: System, Provider Not In  Date of Evaluation: 02/21/2022 Time spent:20 minutes  Chief Complaint:  Chief Complaint   ADHD; Depression; Follow-up     Virtual Visit via Telehealth  I connected with patient by a video enabled telemedicine application with their informed consent, and verified patient privacy and that I am speaking with the correct person using two identifiers.  I am private, in my office and the patient is at home.  I discussed the limitations, risks, security and privacy concerns of performing an evaluation and management service by video and the availability of in person appointments. I also discussed with the patient that there may be a patient responsible charge related to this service. The patient expressed understanding and agreed to proceed.   I discussed the assessment and treatment plan with the patient. The patient was provided an opportunity to ask questions and all were answered. The patient agreed with the plan and demonstrated an understanding of the instructions.   The patient was advised to call back or seek an in-person evaluation if the symptoms worsen or if the condition fails to improve as anticipated.  I provided 20 minutes of non-face-to-face time during this encounter.    HISTORY/CURRENT STATUS: HPI For routine med check.    We changed Adderall to Adzenys in early Feb d/t shortage. It's working well enough. She prefers the Adderall but it's been impossible to find. States that attention is good without easy distractibility.  Able to focus on things and finish tasks to completion.   Mental health has been stable.  She had an episode of being depressed a week or so ago after she and her husband went to [facility] on vacation.  That is the only time that has happened since our last visit.  The time change and her sleep schedule being off was part of the problem.  Not depressed  at all now.  Is able to enjoy things.  Denies decreased energy.  Denies decreased motivation.  Work is going well.  ADLs and personal hygiene are normal.  Appetite has not changed.  No extreme sadness, tearfulness, or feelings of hopelessness.  Sleeps well most of the time.  She does get anxious at times and has Klonopin but takes it as sparingly as possible.  Will get panicky if she does not take 1.  Usually it is more of a generalized sense of unease like something bad is going to happen.  These times are not very often now.  Denies suicidal or homicidal thoughts.  Patient denies increased energy with decreased need for sleep, no increased talkativeness, no racing thoughts, no impulsivity or risky behaviors, no increased spending, no increased libido, no grandiosity, no increased irritability or anger, and no hallucinations.  Denies dizziness, syncope, seizures, numbness, tingling, tremor, tics, unsteady gait, slurred speech, confusion. Denies muscle or joint pain, stiffness, or dystonia. Denies unexplained weight loss, frequent infections, or sores that heal slowly.  No polyphagia, polydipsia, or polyuria. Denies visual changes or paresthesias.   Individual Medical History/ Review of Systems: Changes? :No   has a URI right now.   Past medications for mental health diagnoses include: Lamictal, Seroquel, Klonopin, Adderall, Xanax, Prozac, Lexapro, melatonin, trazodone  Allergies: Aspirin and Nsaids  Current Medications:  Current Outpatient Medications:    [START ON 04/20/2022] Amphetamine ER (ADZENYS XR-ODT) 12.5 MG TBED, Take 12.5 mg by mouth 2 (two) times daily with breakfast and lunch., Disp:  30 tablet, Rfl: 0   [START ON 03/22/2022] Amphetamine ER (ADZENYS XR-ODT) 12.5 MG TBED, Take 12.5 mg by mouth 2 (two) times daily with breakfast and lunch., Disp: 60 tablet, Rfl: 0   Amphetamine ER (ADZENYS XR-ODT) 12.5 MG TBED, Take 12.5 mg by mouth 2 (two) times daily with breakfast and lunch., Disp: 30  tablet, Rfl: 0   clonazePAM (KLONOPIN) 0.5 MG tablet, Take 1 tablet (0.5 mg total) by mouth 2 (two) times daily as needed for anxiety., Disp: 180 tablet, Rfl: 1   lamoTRIgine (LAMICTAL) 200 MG tablet, TAKE 1 TABLET DAILY AFTER BREAKFAST, Disp: 90 tablet, Rfl: 1   Multiple Vitamin (MULTIVITAMIN) tablet, Take 1 tablet by mouth daily., Disp: , Rfl:    omeprazole (PRILOSEC) 40 MG capsule, Take 40 mg by mouth daily., Disp: , Rfl:    QUEtiapine (SEROQUEL XR) 200 MG 24 hr tablet, TAKE 1 TABLET AT BEDTIME, Disp: 90 tablet, Rfl: 3 Medication Side Effects: none  Family Medical/ Social History: Changes?  None  MENTAL HEALTH EXAM:  There were no vitals taken for this visit.There is no height or weight on file to calculate BMI.  General Appearance: Casual, Well Groomed and Obese  Eye Contact:  Good  Speech:  Clear and Coherent and Normal Rate  Volume:  Normal  Mood:  Euthymic  Affect:  Congruent  Thought Process:  Goal Directed and Descriptions of Associations: Circumstantial  Orientation:  Full (Time, Place, and Person)  Thought Content: Logical   Suicidal Thoughts:  No  Homicidal Thoughts:  No  Memory:  WNL  Judgement:  Good  Insight:  Good  Psychomotor Activity:  Normal  Concentration:  Concentration: Good and Attention Span: Good  Recall:  Good  Fund of Knowledge: Good  Language: Good  Assets:  Desire for Improvement  ADL's:  Intact  Cognition: WNL  Prognosis:  Good    DIAGNOSES:    ICD-10-CM   1. Bipolar II disorder, mild, depressed, with mixed features, in full remission (HCC)  F31.81     2. Attention deficit hyperactivity disorder (ADHD), combined type, mild  F90.2     3. Generalized anxiety disorder  F41.1       Receiving Psychotherapy: No    RECOMMENDATIONS:  PDMP reviewed.  Last Adzenys XR ODT filled 01/21/2022. I provided 20 minutes of non-face-to-face time during this encounter, including time spent before and after the visit in records review, medical decision  making, counseling pertinent to today's visit, and charting.  She is doing well from a mental health standpoint so no changes will be made. If she starts having frequent or severe episodes of depression then we will increase the Lamictal but not necessary at this time since she only had 1 episode lasting a few days a few weeks ago which was triggered.  Continue Klonopin 0.5 mg, 1 p.o. twice daily as needed.  Takes sparingly.   Continue Lamictal 200 mg, 1 p.o. every morning. Continue Seroquel XR 200 mg, 1 p.o. nightly. Will have labs drawn at upcoming physical and make sure labs are sent to me.  She understands that the Seroquel can possibly increase glucose and lipids and it does need to be followed annually. Return in 6 months.  Melony Overly, PA-C

## 2022-03-25 ENCOUNTER — Other Ambulatory Visit: Payer: Self-pay | Admitting: Physician Assistant

## 2022-03-25 MED ORDER — ADZENYS XR-ODT 12.5 MG PO TBED
12.5000 mg | EXTENDED_RELEASE_TABLET | Freq: Two times a day (BID) | ORAL | 0 refills | Status: DC
Start: 2022-04-20 — End: 2022-06-24

## 2022-03-25 NOTE — Telephone Encounter (Signed)
Pt called and said that the the July script for [facility] the quantity is wrong. It should be 60 not 30.

## 2022-03-25 NOTE — Telephone Encounter (Signed)
Pharmacy called as well.

## 2022-05-14 ENCOUNTER — Telehealth: Payer: Self-pay | Admitting: Physician Assistant

## 2022-05-14 NOTE — Telephone Encounter (Signed)
Pt called and said that she needs a refill of [facility]  She also would like to go back to adderall xr 20 mg take 2 a day. The pharmacy is [facility] on [address] in [place]

## 2022-05-20 ENCOUNTER — Telehealth: Payer: Self-pay | Admitting: Physician Assistant

## 2022-05-20 NOTE — Telephone Encounter (Signed)
Call from 8/22: "Pt called and said that she needs a refill of [facility].  She also would like to go back to adderall xr 20 mg take 2 a day. The pharmacy is [facility] on [address] in [place]."  I'm not sure this was seen or done?

## 2022-05-20 NOTE — Telephone Encounter (Signed)
   04/22/2022 03/25/2022 1  Adzenys Xr-Odt 12.5 Mg Tablet 60.00 30 [place]

## 2022-05-20 NOTE — Telephone Encounter (Signed)
Patient doesn't like having to dissolve tablet in her mouth and also there is no pharmacy close to her and she doesn't like having to drive that far to the pharmacy.

## 2022-05-21 ENCOUNTER — Other Ambulatory Visit: Payer: Self-pay

## 2022-05-21 ENCOUNTER — Other Ambulatory Visit: Payer: Self-pay | Admitting: Physician Assistant

## 2022-05-21 DIAGNOSIS — F3181 Bipolar II disorder: Secondary | ICD-10-CM

## 2022-05-21 DIAGNOSIS — F411 Generalized anxiety disorder: Secondary | ICD-10-CM

## 2022-05-21 MED ORDER — CLONAZEPAM 0.5 MG PO TABS: 0.5000 mg | ORAL_TABLET | Freq: Two times a day (BID) | ORAL | 1 refills | Status: DC | PRN

## 2022-05-21 MED ORDER — AMPHETAMINE-DEXTROAMPHET ER 20 MG PO CP24: 20.0000 mg | ORAL_CAPSULE | Freq: Two times a day (BID) | ORAL | 0 refills | Status: DC

## 2022-05-21 MED ORDER — ADZENYS XR-ODT 12.5 MG PO TBED: 12.5000 mg | EXTENDED_RELEASE_TABLET | Freq: Two times a day (BID) | ORAL | 0 refills | Status: DC

## 2022-05-21 NOTE — Telephone Encounter (Signed)
Pended both. 

## 2022-05-21 NOTE — Telephone Encounter (Signed)
Pended.

## 2022-05-21 NOTE — Telephone Encounter (Signed)
Canceled Adderall scripts.

## 2022-05-21 NOTE — Telephone Encounter (Signed)
[facility] now does not have Adderall to fill. She has called several pharmacies and cannot find it. SO sorry, but can you please just switch it backt o Adzenys?

## 2022-06-20 ENCOUNTER — Telehealth: Payer: Self-pay | Admitting: Physician Assistant

## 2022-06-20 ENCOUNTER — Other Ambulatory Visit: Payer: Self-pay

## 2022-06-20 NOTE — Telephone Encounter (Signed)
Pt requesting Rx for Adzenys at [facility]. Apt 12/6

## 2022-06-20 NOTE — Telephone Encounter (Signed)
Pended.

## 2022-06-21 MED ORDER — ADZENYS XR-ODT 12.5 MG PO TBED: 12.5000 mg | EXTENDED_RELEASE_TABLET | Freq: Two times a day (BID) | ORAL | 0 refills | Status: DC

## 2022-06-21 NOTE — Telephone Encounter (Signed)
Pharmacy called and said that the script that was sent there quantity should be 60 not 30 . Please cancel and resubmit

## 2022-06-24 ENCOUNTER — Other Ambulatory Visit: Payer: Self-pay

## 2022-06-24 MED ORDER — ADZENYS XR-ODT 12.5 MG PO TBED: 12.5000 mg | EXTENDED_RELEASE_TABLET | Freq: Two times a day (BID) | ORAL | 0 refills | Status: DC

## 2022-06-24 NOTE — Telephone Encounter (Signed)
Corrected

## 2022-07-29 ENCOUNTER — Other Ambulatory Visit: Payer: Self-pay | Admitting: Physician Assistant

## 2022-07-29 ENCOUNTER — Telehealth: Payer: Self-pay | Admitting: Physician Assistant

## 2022-07-29 NOTE — Telephone Encounter (Signed)
Explained to pt Andrea Oliver no longer carries adenys.She will call back and let us know if she wants it sent to [facility] instead

## 2022-07-29 NOTE — Telephone Encounter (Signed)
Andrea Oliver called back at 11:30 to request that you g ahead and send the full prescription for her Adzenys to [facility].  It is closer to her and if they are going to continue providing the medication it would be easier to just use them.

## 2022-07-29 NOTE — Telephone Encounter (Signed)
John's next visit is 08/28/22. She is requesting a refill on Adzenys called to:  [place], [place]   Phone: [phone]  Fax: [phone]

## 2022-07-29 NOTE — Telephone Encounter (Signed)
Filled 10/2

## 2022-07-30 ENCOUNTER — Other Ambulatory Visit: Payer: Self-pay | Admitting: Physician Assistant

## 2022-07-30 MED ORDER — ADZENYS XR-ODT 12.5 MG PO TBED: 12.5000 mg | EXTENDED_RELEASE_TABLET | Freq: Two times a day (BID) | ORAL | 0 refills | Status: DC

## 2022-08-28 ENCOUNTER — Telehealth: Payer: 59 | Admitting: Physician Assistant

## 2022-08-28 ENCOUNTER — Other Ambulatory Visit: Payer: Self-pay

## 2022-08-28 ENCOUNTER — Telehealth: Payer: Self-pay | Admitting: Physician Assistant

## 2022-08-28 MED ORDER — ADZENYS XR-ODT 12.5 MG PO TBED: 12.5000 mg | EXTENDED_RELEASE_TABLET | Freq: Two times a day (BID) | ORAL | 0 refills | Status: DC

## 2022-08-28 NOTE — Telephone Encounter (Signed)
Pt called and rs for January since teresa is out today. She needs a refill on her adzeny's 12.5 mg. Pharmacy is [facility] in [place]

## 2022-08-28 NOTE — Telephone Encounter (Signed)
Pended to [place] [facility] for [place].

## 2022-10-01 ENCOUNTER — Encounter: Payer: Self-pay | Admitting: Physician Assistant

## 2022-10-01 ENCOUNTER — Telehealth (INDEPENDENT_AMBULATORY_CARE_PROVIDER_SITE_OTHER): Payer: 59 | Admitting: Physician Assistant

## 2022-10-01 DIAGNOSIS — F3181 Bipolar II disorder: Secondary | ICD-10-CM | POA: Diagnosis not present

## 2022-10-01 DIAGNOSIS — F411 Generalized anxiety disorder: Secondary | ICD-10-CM | POA: Diagnosis not present

## 2022-10-01 MED ORDER — ADZENYS XR-ODT 12.5 MG PO TBED: 12.5000 mg | EXTENDED_RELEASE_TABLET | Freq: Two times a day (BID) | ORAL | 0 refills | Status: DC

## 2022-10-01 MED ORDER — LAMOTRIGINE 200 MG PO TABS: ORAL_TABLET | ORAL | 3 refills | Status: DC

## 2022-10-01 MED ORDER — QUETIAPINE FUMARATE ER 200 MG PO TB24: 200.0000 mg | ORAL_TABLET | Freq: Every day | ORAL | 3 refills | Status: DC

## 2022-10-01 NOTE — Progress Notes (Signed)
[facility]  Patient ID: Andrea Oliver,  MRN: [id number]  PCP: System, Provider Not In  Date of Evaluation: 10/01/2022 Time spent:20 minutes  Chief Complaint:  Chief Complaint   ADD; Depression; Anxiety; Follow-up    Virtual Visit via Telehealth  I connected with patient by a video enabled telemedicine application with their informed consent, and verified patient privacy and that I am speaking with the correct person using two identifiers.  I am private, in my office and the patient is at home.  I discussed the limitations, risks, security and privacy concerns of performing an evaluation and management service by video and the availability of in person appointments. I also discussed with the patient that there may be a patient responsible charge related to this service. The patient expressed understanding and agreed to proceed.   I discussed the assessment and treatment plan with the patient. The patient was provided an opportunity to ask questions and all were answered. The patient agreed with the plan and demonstrated an understanding of the instructions.   The patient was advised to call back or seek an in-person evaluation if the symptoms worsen or if the condition fails to improve as anticipated.  I provided 20 minutes of non-face-to-face time during this encounter.    HISTORY/CURRENT STATUS: HPI For routine med check.    Andrea Oliver is doing well.  She is still working from home and likes that.  There is some concern that her company may sell and she would need to find a new job but for now they are will be no changes.  The Adzenys XR ODT is working well.  She has not been able to find Adderall in months so this is the next best thing.  States that attention is good without easy distractibility.  Able to focus on things and finish tasks to completion.   Patient is able to enjoy things.  Energy and motivation are good.  No extreme sadness, tearfulness, or feelings of  hopelessness.  Sleeps well most of the time. ADLs and personal hygiene are normal.    Appetite has not changed.  Weight is stable.   Denies suicidal or homicidal thoughts.  Patient denies increased energy with decreased need for sleep, increased talkativeness, racing thoughts, impulsivity or risky behaviors, increased spending, increased libido, grandiosity, increased irritability or anger, paranoia, or hallucinations.  Denies dizziness, syncope, seizures, numbness, tingling, tremor, tics, unsteady gait, slurred speech, confusion. Denies muscle or joint pain, stiffness, or dystonia. Denies unexplained weight loss, frequent infections, or sores that heal slowly.  No polyphagia, polydipsia, or polyuria. Denies visual changes or paresthesias.   Individual Medical History/ Review of Systems: Changes? :No     Past medications for mental health diagnoses include: Lamictal, Seroquel, Klonopin, Adderall, Xanax, Prozac, Lexapro, melatonin, trazodone  Allergies: Aspirin and Nsaids  Current Medications:  Current Outpatient Medications:    clonazePAM (KLONOPIN) 0.5 MG tablet, Take 1 tablet (0.5 mg total) by mouth 2 (two) times daily as needed for anxiety., Disp: 180 tablet, Rfl: 1   Multiple Vitamin (MULTIVITAMIN) tablet, Take 1 tablet by mouth daily., Disp: , Rfl:    Amphetamine ER (ADZENYS XR-ODT) 12.5 MG TBED, Take 12.5 mg by mouth 2 (two) times daily with breakfast and lunch., Disp: 60 tablet, Rfl: 0   [START ON 10/31/2022] Amphetamine ER (ADZENYS XR-ODT) 12.5 MG TBED, Take 12.5 mg by mouth 2 (two) times daily with breakfast and lunch., Disp: 60 tablet, Rfl: 0   [START ON 11/28/2022] Amphetamine ER (ADZENYS XR-ODT)  12.5 MG TBED, Take 12.5 mg by mouth 2 (two) times daily with breakfast and lunch., Disp: 30 tablet, Rfl: 0   amphetamine-dextroamphetamine (ADDERALL XR) 20 MG 24 hr capsule, Take 1 capsule (20 mg total) by mouth 2 (two) times daily. (Patient not taking: Reported on 10/01/2022), Disp: 60 capsule,  Rfl: 0   lamoTRIgine (LAMICTAL) 200 MG tablet, TAKE 1 TABLET DAILY AFTER BREAKFAST, Disp: 90 tablet, Rfl: 3   omeprazole (PRILOSEC) 40 MG capsule, Take 40 mg by mouth daily. (Patient not taking: Reported on 10/01/2022), Disp: , Rfl:    QUEtiapine (SEROQUEL XR) 200 MG 24 hr tablet, Take 1 tablet (200 mg total) by mouth at bedtime., Disp: 90 tablet, Rfl: 3 Medication Side Effects: none  Family Medical/ Social History: Changes?  None  MENTAL HEALTH EXAM:  There were no vitals taken for this visit.There is no height or weight on file to calculate BMI.  General Appearance: Casual, Well Groomed and Obese  Eye Contact:  Good  Speech:  Clear and Coherent and Normal Rate  Volume:  Normal  Mood:  Euthymic  Affect:  Congruent  Thought Process:  Goal Directed and Descriptions of Associations: Circumstantial  Orientation:  Full (Time, Place, and Person)  Thought Content: Logical   Suicidal Thoughts:  No  Homicidal Thoughts:  No  Memory:  WNL  Judgement:  Good  Insight:  Good  Psychomotor Activity:  Normal  Concentration:  Concentration: Good and Attention Span: Good  Recall:  Good  Fund of Knowledge: Good  Language: Good  Assets:  Desire for Improvement Financial Resources/Insurance Housing Transportation Vocational/Educational  ADL's:  Intact  Cognition: WNL  Prognosis:  Good   DIAGNOSES:    ICD-10-CM   1. Bipolar II disorder, mild, depressed, with mixed features, in full remission (HCC)  F31.81 lamoTRIgine (LAMICTAL) 200 MG tablet    QUEtiapine (SEROQUEL XR) 200 MG 24 hr tablet    2. Generalized anxiety disorder  F41.1 QUEtiapine (SEROQUEL XR) 200 MG 24 hr tablet     Receiving Psychotherapy: No   RECOMMENDATIONS:  PDMP reviewed.  Last Adzenys XR ODT filled 08/28/2022.  Klonopin filled 08/29/2022. I provided 20 minutes of non-face-to-face time during this encounter, including time spent before and after the visit in records review, medical decision making, counseling pertinent to  today's visit, and charting.   She is doing well with current medications so no changes need to be made. I stressed the importance of having labs drawn and the risk of elevated glucose and/or hyperlipidemia due to the fact that she is on an antipsychotic.  I could order those labs and mail the orders to her or she can have her PCP draw them.  She will make an appointment with her PCP for a routine physical soon.  I have stressed the importance of having them send the lab results to me.  She verbalizes understanding.  Continue Klonopin 0.5 mg, 1 p.o. twice daily as needed.  Takes sparingly.   Continue Lamictal 200 mg, 1 p.o. every morning. Continue Seroquel XR 200 mg, 1 p.o. nightly. Return in 6 months.  Melony Overly, PA-C

## 2022-12-26 ENCOUNTER — Telehealth: Payer: Self-pay | Admitting: Physician Assistant

## 2022-12-26 ENCOUNTER — Other Ambulatory Visit: Payer: Self-pay | Admitting: Physician Assistant

## 2022-12-26 MED ORDER — ADZENYS XR-ODT 12.5 MG PO TBED: 12.5000 mg | EXTENDED_RELEASE_TABLET | Freq: Two times a day (BID) | ORAL | 0 refills | Status: DC

## 2022-12-26 NOTE — Telephone Encounter (Signed)
3 Rx sent

## 2022-12-26 NOTE — Telephone Encounter (Signed)
Pt called and said that she needs a refill on her adzenys  12.5 mg quantity of 60 to [facility]

## 2023-04-04 ENCOUNTER — Encounter: Payer: Self-pay | Admitting: Physician Assistant

## 2023-04-04 ENCOUNTER — Other Ambulatory Visit: Payer: Self-pay

## 2023-04-04 ENCOUNTER — Telehealth: Payer: Self-pay | Admitting: Physician Assistant

## 2023-04-04 ENCOUNTER — Telehealth (INDEPENDENT_AMBULATORY_CARE_PROVIDER_SITE_OTHER): Payer: 59 | Admitting: Physician Assistant

## 2023-04-04 DIAGNOSIS — R5381 Other malaise: Secondary | ICD-10-CM

## 2023-04-04 DIAGNOSIS — Z79899 Other long term (current) drug therapy: Secondary | ICD-10-CM

## 2023-04-04 DIAGNOSIS — F902 Attention-deficit hyperactivity disorder, combined type: Secondary | ICD-10-CM | POA: Diagnosis not present

## 2023-04-04 DIAGNOSIS — F3181 Bipolar II disorder: Secondary | ICD-10-CM | POA: Diagnosis not present

## 2023-04-04 DIAGNOSIS — F411 Generalized anxiety disorder: Secondary | ICD-10-CM

## 2023-04-04 MED ORDER — ADZENYS XR-ODT 12.5 MG PO TBED: 12.5000 mg | EXTENDED_RELEASE_TABLET | Freq: Two times a day (BID) | ORAL | 0 refills | Status: DC

## 2023-04-04 MED ORDER — CLONAZEPAM 0.5 MG PO TABS: 0.5000 mg | ORAL_TABLET | Freq: Two times a day (BID) | ORAL | 1 refills | Status: DC | PRN

## 2023-04-04 NOTE — Telephone Encounter (Signed)
Pended.

## 2023-04-04 NOTE — Telephone Encounter (Signed)
Pharmacy called and said that the three adzenys was sent to the long term care pharmacy and they don't do  comercial fills. Please send it to the [facility] same address just don't put long term  when you send it

## 2023-04-04 NOTE — Telephone Encounter (Signed)
[facility] sent PA request for ADZENYS XR 12.5mg , See CMM

## 2023-04-04 NOTE — Progress Notes (Signed)
[facility]  Patient ID: Andrea Oliver,  MRN: [id number]  PCP: System, Provider Not In  Date of Evaluation: 04/04/2023 Time spent: 32 minutes  Chief Complaint:  Chief Complaint   ADD; Anxiety    Virtual Visit via Telehealth  I connected with patient by a video enabled telemedicine application with their informed consent, and verified patient privacy and that I am speaking with the correct person using two identifiers.  I am private, in my office and the patient is at home.  I discussed the limitations, risks, security and privacy concerns of performing an evaluation and management service by video and the availability of in person appointments. I also discussed with the patient that there may be a patient responsible charge related to this service. The patient expressed understanding and agreed to proceed.   I discussed the assessment and treatment plan with the patient. The patient was provided an opportunity to ask questions and all were answered. The patient agreed with the plan and demonstrated an understanding of the instructions.   The patient was advised to call back or seek an in-person evaluation if the symptoms worsen or if the condition fails to improve as anticipated.  I provided 32 minutes of non-face-to-face time during this encounter.  HISTORY/CURRENT STATUS: HPI For routine med check.    Andrea Oliver is doing well w/ all her meds.  They are working as intended.  She sleeps well, occasionally has trouble falling asleep and Klonopin helps.  She rarely uses it during the day, not having panic attacks but does get overwhelmed at times.  Patient is able to enjoy things.  Energy and motivation are fair.  She does get tired easily, has a history of anemia.  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  It is secure for now.  No extreme sadness, tearfulness, or feelings of  hopelessness. ADLs and personal hygiene are normal.  Appetite has not changed.  Weight is stable.  Denies suicidal or homicidal thoughts.  The Adzenys XR ODT is still effective.  She prefers to stay on that for a while longer because it is readily available and Adderall can still be hit or miss.  States that attention is good without easy distractibility.  Able to focus on things and finish tasks to completion.   Patient denies increased energy with decreased need for sleep, increased talkativeness, racing thoughts, impulsivity or risky behaviors, increased spending, increased libido, grandiosity, increased irritability or anger, paranoia, or hallucinations.  Denies dizziness, syncope, seizures, numbness, tingling, tremor, tics, unsteady gait, slurred speech, confusion. Denies muscle or joint pain, stiffness, or dystonia. Denies unexplained weight loss, frequent infections, or sores that heal slowly.  No polyphagia, polydipsia, or polyuria.  Denies visual changes or paresthesias.   Individual Medical History/ Review of Systems: Changes? :No     Past medications for mental health diagnoses include: Lamictal, Seroquel, Klonopin, Adderall, Xanax, Prozac, Lexapro, melatonin, trazodone  Allergies: Aspirin and Nsaids  Current Medications:  Current Outpatient Medications:    lamoTRIgine (LAMICTAL) 200 MG tablet, TAKE 1 TABLET DAILY AFTER BREAKFAST, Disp: 90 tablet, Rfl: 3   Multiple Vitamin (MULTIVITAMIN) tablet, Take 1 tablet by mouth daily., Disp: , Rfl:    QUEtiapine (SEROQUEL XR) 200 MG 24 hr tablet, Take 1 tablet (200 mg total) by mouth at bedtime., Disp: 90 tablet, Rfl: 3   [START ON 05/04/2023] Amphetamine ER (ADZENYS  XR-ODT) 12.5 MG TBED, Take 12.5 mg by mouth 2 (two) times daily with breakfast and lunch., Disp: 60 tablet, Rfl: 0   [START ON 06/03/2023] Amphetamine ER (ADZENYS XR-ODT) 12.5 MG TBED, Take 12.5 mg by mouth 2 (two) times daily with breakfast and lunch., Disp: 60 tablet, Rfl: 0    Amphetamine ER (ADZENYS XR-ODT) 12.5 MG TBED, Take 12.5 mg by mouth 2 (two) times daily with breakfast and lunch., Disp: 60 tablet, Rfl: 0   clonazePAM (KLONOPIN) 0.5 MG tablet, Take 1 tablet (0.5 mg total) by mouth 2 (two) times daily as needed for anxiety., Disp: 180 tablet, Rfl: 1   omeprazole (PRILOSEC) 40 MG capsule, Take 40 mg by mouth daily. (Patient not taking: Reported on 10/01/2022), Disp: , Rfl:  Medication Side Effects: none  Family Medical/ Social History: Changes?  None  MENTAL HEALTH EXAM:  There were no vitals taken for this visit.There is no height or weight on file to calculate BMI.  General Appearance: Casual, Well Groomed and Obese  Eye Contact:  Good  Speech:  Clear and Coherent and Normal Rate  Volume:  Normal  Mood:  Euthymic  Affect:  Congruent  Thought Process:  Goal Directed and Descriptions of Associations: Circumstantial  Orientation:  Full (Time, Place, and Person)  Thought Content: Logical   Suicidal Thoughts:  No  Homicidal Thoughts:  No  Memory:  WNL  Judgement:  Good  Insight:  Good  Psychomotor Activity:  Normal  Concentration:  Concentration: Good and Attention Span: Good  Recall:  Good  Fund of Knowledge: Good  Language: Good  Assets:  Communication Skills Desire for Improvement Financial Resources/Insurance Housing Transportation Vocational/Educational  ADL's:  Intact  Cognition: WNL  Prognosis:  Good   DIAGNOSES:    ICD-10-CM   1. Bipolar II disorder, mild, depressed, with mixed features, in full remission (HCC)  F31.81 clonazePAM (KLONOPIN) 0.5 MG tablet    CBC with Differential/Platelet    Comprehensive metabolic panel    Lipid panel    Hemoglobin A1c    2. Generalized anxiety disorder  F41.1 clonazePAM (KLONOPIN) 0.5 MG tablet    3. Attention deficit hyperactivity disorder (ADHD), combined type, mild  F90.2     4. Malaise  R53.81 TSH    Hemoglobin A1c    VITAMIN D 25 Hydroxy (Vit-D Deficiency, Fractures)    5. Encounter for  long-term (current) use of medications  Z79.899 CBC with Differential/Platelet    Comprehensive metabolic panel    TSH    Lipid panel    Hemoglobin A1c     Receiving Psychotherapy: No   RECOMMENDATIONS:  PDMP reviewed.  Last Adzenys XR ODT filled 03/03/2023.  Klonopin filled 08/29/2022.  I provided 32 minutes of non-face-to-face time during this encounter, including time spent before and after the visit in records review, medical decision making, counseling pertinent to today's visit, and charting.   She is doing well so no changes need to be made.  She is overdue for labs so those will be ordered.  Continue Klonopin 0.5 mg, 1 p.o. twice daily as needed.  Takes sparingly.   Continue Lamictal 200 mg, 1 p.o. every morning. Continue Seroquel XR 200 mg, 1 p.o. nightly. Labs ordered as noted above.  Those will be mailed to her.  She knows to go fasting. Return in 6 months.  Melony Overly, PA-C

## 2023-04-06 NOTE — Telephone Encounter (Signed)
PA pending

## 2023-05-11 NOTE — Telephone Encounter (Signed)
Prior Authorization for ADZENYS XR-ODT 12.5 MG resubmitted with [facility], still Denied for not meeting the criteria. See pt's letter.  Per your health plan's criteria, this drug is covered if you meet the following: There are paid claims or your doctor provides medical records (for example: chart notes) showing you have tried or cannot use at least two covered (formulary equivalent) drugs (if only one or only two covered drugs are available, you must have tried or cannot use all that are available). Covered drugs include: (I) Atomoxetine. (II) Brand Concerta, Jornay PM, methylphenidate CD, methylphenidate ER capsule, methylphenidate ER, methylphenidate ER 24hr capsule, or methylphenidate ER (LA)*. (III) Dexmethylphenidate ER. (IV) Lisdexamfetamine or brand Vyvanse*. The information provided does not show that you meet the criteria listed above.

## 2023-05-12 NOTE — Telephone Encounter (Signed)
Patient was able to fill Adzenys 8/12, ? Using coupon. Said she would stay on Adzenys for now and switch when current RF run out. She will switch to Adderall then.

## 2023-05-12 NOTE — Telephone Encounter (Signed)
Please call her and see if she will restart Adderall, it is more readily available now. Or her insurance is forcing [place] to use other meds. I would give Concerta, Jornay PM, or Vyvanse, Vyvanse would be my first choice.

## 2023-06-10 ENCOUNTER — Other Ambulatory Visit: Payer: Self-pay

## 2023-06-10 MED ORDER — ERGOCALCIFEROL 1.25 MG (50000 UT) PO CAPS: 50000.0000 [IU] | ORAL_CAPSULE | ORAL | 0 refills | Status: DC

## 2023-06-10 NOTE — Progress Notes (Signed)
Vitamin D level is very low.  I recommend starting 50,000 IUs once a week for 3 months and then we will recheck it.  CBC, CMP, lipid panel, kidney and liver test are all normal.  Hemoglobin A1c and TSH are normal as well.

## 2023-07-03 ENCOUNTER — Telehealth: Payer: Self-pay | Admitting: Physician Assistant

## 2023-07-03 ENCOUNTER — Other Ambulatory Visit: Payer: Self-pay

## 2023-07-03 MED ORDER — ADZENYS XR-ODT 12.5 MG PO TBED: 12.5000 mg | EXTENDED_RELEASE_TABLET | Freq: Two times a day (BID) | ORAL | 0 refills | Status: DC

## 2023-07-03 NOTE — Telephone Encounter (Signed)
Next visit is 10/02/23. Requesting a refill on Adzenys called to:  [facility] - [place], [place] - [address] [place]. [address]   Phone: [phone]  Fax: [phone]

## 2023-07-03 NOTE — Telephone Encounter (Signed)
PENDED

## 2023-07-03 NOTE — Telephone Encounter (Signed)
Patient was able to fill Adzenys 8/12, ? Using coupon. Said she would stay on Adzenys for now and switch when current RF run out. She will switch to Adderall then.       Called pt to confirm she wanted Adzenys, she said yes, she does not have an issue getting the medication

## 2023-08-04 ENCOUNTER — Other Ambulatory Visit: Payer: Self-pay

## 2023-08-04 ENCOUNTER — Telehealth: Payer: Self-pay | Admitting: Physician Assistant

## 2023-08-04 MED ORDER — ADZENYS XR-ODT 12.5 MG PO TBED: 12.5000 mg | EXTENDED_RELEASE_TABLET | Freq: Two times a day (BID) | ORAL | 0 refills | Status: DC

## 2023-08-04 NOTE — Telephone Encounter (Signed)
Andrea Oliver called at 9:55 to request refill of her Adzenys XR.  Appt 10/02/23.  Send to [facility] - [place], [place] - [address] [place].

## 2023-08-04 NOTE — Telephone Encounter (Signed)
Pended.

## 2023-08-07 ENCOUNTER — Other Ambulatory Visit: Payer: Self-pay | Admitting: Physician Assistant

## 2023-08-07 DIAGNOSIS — F3181 Bipolar II disorder: Secondary | ICD-10-CM

## 2023-08-09 ENCOUNTER — Other Ambulatory Visit: Payer: Self-pay | Admitting: Physician Assistant

## 2023-08-09 DIAGNOSIS — F411 Generalized anxiety disorder: Secondary | ICD-10-CM

## 2023-08-09 DIAGNOSIS — F3181 Bipolar II disorder: Secondary | ICD-10-CM

## 2023-09-23 ENCOUNTER — Other Ambulatory Visit: Payer: Self-pay | Admitting: Physician Assistant

## 2023-09-23 DIAGNOSIS — F411 Generalized anxiety disorder: Secondary | ICD-10-CM

## 2023-09-23 DIAGNOSIS — F3181 Bipolar II disorder: Secondary | ICD-10-CM

## 2023-09-25 NOTE — Telephone Encounter (Signed)
LF 10/18

## 2023-10-02 ENCOUNTER — Telehealth: Payer: 59 | Admitting: Physician Assistant

## 2023-10-02 ENCOUNTER — Encounter: Payer: Self-pay | Admitting: Physician Assistant

## 2023-10-02 DIAGNOSIS — E559 Vitamin D deficiency, unspecified: Secondary | ICD-10-CM | POA: Diagnosis not present

## 2023-10-02 DIAGNOSIS — F3181 Bipolar II disorder: Secondary | ICD-10-CM

## 2023-10-02 DIAGNOSIS — F411 Generalized anxiety disorder: Secondary | ICD-10-CM

## 2023-10-02 DIAGNOSIS — Z79899 Other long term (current) drug therapy: Secondary | ICD-10-CM

## 2023-10-02 DIAGNOSIS — R5381 Other malaise: Secondary | ICD-10-CM | POA: Diagnosis not present

## 2023-10-02 DIAGNOSIS — F902 Attention-deficit hyperactivity disorder, combined type: Secondary | ICD-10-CM

## 2023-10-02 MED ORDER — ADZENYS XR-ODT 12.5 MG PO TBED: 12.5000 mg | EXTENDED_RELEASE_TABLET | Freq: Two times a day (BID) | ORAL | 0 refills | Status: DC

## 2023-10-02 MED ORDER — ERGOCALCIFEROL 1.25 MG (50000 UT) PO CAPS: 50000.0000 [IU] | ORAL_CAPSULE | ORAL | 1 refills | Status: AC

## 2023-10-02 NOTE — Progress Notes (Signed)
 [facility]  Patient ID: Andrea Oliver,  MRN: [id number]  PCP: System, Provider Not In  Date of Evaluation: 10/02/2023 Time spent:30 minutes  Chief Complaint:  Chief Complaint   Anxiety; ADD; Follow-up    Virtual Visit via Telehealth  I connected with patient by a video enabled telemedicine application with their informed consent, and verified patient privacy and that I am speaking with the correct person using two identifiers.  I am private, in my office and the patient is at home.  I discussed the limitations, risks, security and privacy concerns of performing an evaluation and management service by video and the availability of in person appointments. I also discussed with the patient that there may be a patient responsible charge related to this service. The patient expressed understanding and agreed to proceed.   I discussed the assessment and treatment plan with the patient. The patient was provided an opportunity to ask questions and all were answered. The patient agreed with the plan and demonstrated an understanding of the instructions.   The patient was advised to call back or seek an in-person evaluation if the symptoms worsen or if the condition fails to improve as anticipated.  I provided 30 minutes of non-face-to-face time during this encounter.  HISTORY/CURRENT STATUS: HPI For routine med check.    Took Vitamin D , ran out though.  Feels like she was less tired when she took it.   Other than that she feels well.  The Adzenys  XR ODT is still effective.  States that attention is good without easy distractibility.  Able to focus on things and finish tasks to completion.   Patient is able to enjoy things.  Energy and motivation are good.  Work is going well.   No extreme sadness, tearfulness, or feelings of hopelessness.  Sleeps well most of the time. ADLs and personal hygiene are normal.   No change in memory.  Appetite has not changed.  Weight is stable.   Still gets anxious at times.  Not having panic attacks so much but more of getting overwhelmed.  Klonopin  helps.  She tries not to take it anywhere near the time she takes Adzenys  XR ODT.  Denies suicidal or homicidal thoughts.  Patient denies increased energy with decreased need for sleep, increased talkativeness, racing thoughts, impulsivity or risky behaviors, increased spending, increased libido, grandiosity, increased irritability or anger, paranoia, or hallucinations.  Denies dizziness, syncope, seizures, numbness, tingling, tremor, tics, unsteady gait, slurred speech, confusion. Denies muscle or joint pain, stiffness, or dystonia. Denies unexplained weight loss, frequent infections, or sores that heal slowly.  No polyphagia, polydipsia, or polyuria.  Denies visual changes or paresthesias.   Individual Medical History/ Review of Systems: Changes? :Yes    left knee issues-given injection last fall.   Past medications for mental health diagnoses include: Lamictal , Seroquel , Klonopin , Adderall, Xanax, Prozac , Lexapro, melatonin, trazodone   Allergies: Aspirin and Nsaids  Current Medications:  Current Outpatient Medications:    clonazePAM  (KLONOPIN ) 0.5 MG tablet, TAKE 1 TABLET BY MOUTH TWICE  DAILY AS NEEDED FOR ANXIETY, Disp: 180 tablet, Rfl: 0   lamoTRIgine  (LAMICTAL ) 200 MG tablet, TAKE 1 TABLET BY MOUTH DAILY  AFTER BREAKFAST, Disp: 90 tablet, Rfl: 1   Multiple Vitamin (MULTIVITAMIN) tablet, Take 1 tablet by mouth daily., Disp: , Rfl:    QUEtiapine  (SEROQUEL  XR) 200 MG 24 hr tablet, TAKE 1 TABLET BY MOUTH AT  BEDTIME, Disp: 90 tablet, Rfl: 1   [START ON 11/05/2023] Amphetamine  ER (ADZENYS  XR-ODT) 12.5  MG TBED, Take 12.5 mg by mouth 2 (two) times daily with breakfast and lunch., Disp: 60 tablet, Rfl: 0   [START ON 12/02/2023] Amphetamine  ER (ADZENYS  XR-ODT) 12.5 MG TBED, Take 12.5 mg by mouth 2 (two) times daily with breakfast and lunch., Disp: 60 tablet, Rfl: 0   [START ON 01/01/2024]  Amphetamine  ER (ADZENYS  XR-ODT) 12.5 MG TBED, Take 12.5 mg by mouth 2 (two) times daily with breakfast and lunch., Disp: 60 tablet, Rfl: 0   ergocalciferol  (VITAMIN D2) 1.25 MG (50000 UT) capsule, Take 1 capsule (50,000 Units total) by mouth once a week., Disp: 12 capsule, Rfl: 1   omeprazole (PRILOSEC) 40 MG capsule, Take 40 mg by mouth daily. (Patient not taking: Reported on 10/02/2023), Disp: , Rfl:  Medication Side Effects: none  Family Medical/ Social History: Changes?  None  MENTAL HEALTH EXAM:  There were no vitals taken for this visit.There is no height or weight on file to calculate BMI.  General Appearance: Casual, Well Groomed and Obese  Eye Contact:  Good  Speech:  Clear and Coherent and Normal Rate  Volume:  Normal  Mood:  Euthymic  Affect:  Congruent  Thought Process:  Goal Directed and Descriptions of Associations: Circumstantial  Orientation:  Full (Time, Place, and Person)  Thought Content: Logical   Suicidal Thoughts:  No  Homicidal Thoughts:  No  Memory:  WNL  Judgement:  Good  Insight:  Good  Psychomotor Activity:  Normal  Concentration:  Concentration: Good and Attention Span: Good  Recall:  Good  Fund of Knowledge: Good  Language: Good  Assets:  Communication Skills Desire for Improvement Financial Resources/Insurance Housing Resilience Transportation Vocational/Educational  ADL's:  Intact  Cognition: WNL  Prognosis:  Good   DIAGNOSES:    ICD-10-CM   1. Bipolar II disorder, mild, depressed, with mixed features, in full remission (HCC)  F31.81     2. Vitamin D  deficiency  E55.9 VITAMIN D  25 Hydroxy (Vit-D Deficiency, Fractures)    3. Encounter for long-term (current) use of medications  Z79.899 VITAMIN D  25 Hydroxy (Vit-D Deficiency, Fractures)    4. Malaise  R53.81 VITAMIN D  25 Hydroxy (Vit-D Deficiency, Fractures)    5. Generalized anxiety disorder  F41.1     6. Attention deficit hyperactivity disorder (ADHD), combined type, mild  F90.2       Receiving Psychotherapy: No   RECOMMENDATIONS:  PDMP reviewed.  Last Adzenys  XR ODT filled 09/02/2023.  Klonopin  filled 09/25/2023. I provided 30 minutes of non-face-to-face time during this encounter, including time spent before and after the visit in records review, medical decision making, counseling pertinent to today's visit, and charting.   Except for the fatigue she is doing well.  She felt better when she was on the weekly dose of vitamin D .  I recommend rechecking labs and starting back on vitamin D  weekly.  The order for lab work will be mailed to her since this is a telehealth visit.  She understands to have it drawn at some point within the next few weeks.  No changes in treatment are necessary.  Continue Adzenys  XR ODT 12.5 mg, 1 p.o. at breakfast and 1 p.o. at lunch. Continue Klonopin  0.5 mg, 1 p.o. twice daily as needed.  Takes sparingly.   Restart vitamin D  50,000 units once a week. Continue Lamictal  200 mg, 1 p.o. every morning. Continue Seroquel  XR 200 mg, 1 p.o. nightly. Return in 6 months.  Verneita Cooks, PA-C

## 2023-11-19 ENCOUNTER — Telehealth: Payer: Self-pay

## 2023-11-19 NOTE — Telephone Encounter (Signed)
Yes, that's right

## 2023-11-19 NOTE — Telephone Encounter (Signed)
 Patient informed that insurance will not cover the high dose of Adzenys and that you will switch back to Adderall.   From 08/22/21 note:  Continue Adderall XR 30 mg +10 mg daily.   11/05/2021 08/22/2021 2  Dextroamp-Amphet Er 30 Mg Cap 30.00 [address] [place] [id number] [facility] [phone]) 0/0  Private Pay [place] 11/05/2021 08/22/2021 2  Dextroamp-Amphet Er 10 Mg Cap  If this dosing is correct I will pend to the requested pharmacy when due. She last filled Adzenys 2/12.

## 2023-11-27 ENCOUNTER — Telehealth: Payer: Self-pay | Admitting: Physician Assistant

## 2023-11-27 NOTE — Telephone Encounter (Signed)
 Please see msg from patient and advise. She faxed a letter over, which I put in your box.

## 2023-11-27 NOTE — Telephone Encounter (Signed)
 Pt called and said that things have happened over the last month and she needs to be seen asap. She sent over paperwork to be out of work. I told her that teresa would need to speak to her before form could be filled out. She would like to be seen in  a work in slot. Please call her at [phone]

## 2023-11-28 ENCOUNTER — Telehealth: Admitting: Physician Assistant

## 2023-11-28 ENCOUNTER — Encounter: Payer: Self-pay | Admitting: Physician Assistant

## 2023-11-28 DIAGNOSIS — F4321 Adjustment disorder with depressed mood: Secondary | ICD-10-CM

## 2023-11-28 DIAGNOSIS — F4323 Adjustment disorder with mixed anxiety and depressed mood: Secondary | ICD-10-CM | POA: Diagnosis not present

## 2023-11-28 DIAGNOSIS — F902 Attention-deficit hyperactivity disorder, combined type: Secondary | ICD-10-CM

## 2023-11-28 DIAGNOSIS — G4700 Insomnia, unspecified: Secondary | ICD-10-CM | POA: Diagnosis not present

## 2023-11-28 NOTE — Telephone Encounter (Signed)
 Please see if she can take the 1:30 appt today.  Thank you.
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
## 2023-11-28 NOTE — Telephone Encounter (Signed)
 She is taking the 1:30 appt today

## 2023-12-04 DIAGNOSIS — Z0289 Encounter for other administrative examinations: Secondary | ICD-10-CM

## 2023-12-04 NOTE — Telephone Encounter (Signed)
 LVM to [facility]. Received PA request for Adzenys. Is she going to fill Adzenys or Adderall this month? Also sent MyChart message.

## 2023-12-04 NOTE — Telephone Encounter (Signed)
 Pt said she prefers to stay on Adzenys. She said pharmacy has a coupon program. It generates a PA automatically.  She has RF available so nothing needs to be done at this time.

## 2023-12-16 ENCOUNTER — Telehealth: Payer: Self-pay | Admitting: Physician Assistant

## 2023-12-16 NOTE — Telephone Encounter (Signed)
 Pt faxed STD forms for [facility]. Reported she faxed 3/11. We did not receive. Pt paid $45    Put in Traci's box

## 2023-12-17 NOTE — Telephone Encounter (Signed)
 Pt had forms faxed on 3/18 according to media. This is a duplicate

## 2023-12-29 ENCOUNTER — Other Ambulatory Visit: Payer: Self-pay | Admitting: Physician Assistant

## 2023-12-29 DIAGNOSIS — F3181 Bipolar II disorder: Secondary | ICD-10-CM

## 2023-12-29 DIAGNOSIS — F411 Generalized anxiety disorder: Secondary | ICD-10-CM

## 2023-12-30 ENCOUNTER — Telehealth: Admitting: Physician Assistant

## 2023-12-30 ENCOUNTER — Encounter: Payer: Self-pay | Admitting: Physician Assistant

## 2023-12-30 DIAGNOSIS — F4321 Adjustment disorder with depressed mood: Secondary | ICD-10-CM

## 2023-12-30 DIAGNOSIS — F411 Generalized anxiety disorder: Secondary | ICD-10-CM | POA: Diagnosis not present

## 2023-12-30 DIAGNOSIS — F4323 Adjustment disorder with mixed anxiety and depressed mood: Secondary | ICD-10-CM

## 2023-12-30 DIAGNOSIS — F3181 Bipolar II disorder: Secondary | ICD-10-CM | POA: Diagnosis not present

## 2023-12-30 DIAGNOSIS — F514 Sleep terrors [night terrors]: Secondary | ICD-10-CM

## 2023-12-30 MED ORDER — CLONAZEPAM 0.5 MG PO TABS: 0.5000 mg | ORAL_TABLET | Freq: Two times a day (BID) | ORAL | 0 refills | Status: DC | PRN

## 2023-12-30 NOTE — Progress Notes (Signed)
 [facility]  Patient ID: Andrea Oliver,  MRN: [id number]  PCP: System, Provider Not In  Date of Evaluation: 12/30/2023 Time spent:20 minutes  Chief Complaint:  Chief Complaint   Follow-up    Virtual Visit via Telehealth  I connected with patient by a video enabled telemedicine application with their informed consent, and verified patient privacy and that I am speaking with the correct person using two identifiers.  I am private, in my office and the patient is at home.  I discussed the limitations, risks, security and privacy concerns of performing an evaluation and management service by video and the availability of in person appointments. I also discussed with the patient that there may be a patient responsible charge related to this service. The patient expressed understanding and agreed to proceed.   I discussed the assessment and treatment plan with the patient. The patient was provided an opportunity to ask questions and all were answered. The patient agreed with the plan and demonstrated an understanding of the instructions.   The patient was advised to call back or seek an in-person evaluation if the symptoms worsen or if the condition fails to improve as anticipated.  I provided 20 minutes of non-face-to-face time during this encounter.  HISTORY/CURRENT STATUS: HPI For routine med check.    Extremely sad since her brother was murdered. The autopsy results are back, which has made it even more real, painting a complete picture of how he died. Cries easily but feels like that's to be expected. She isn't working at my recommendation. 'I don't think I could do it right now.'  She and some of her family are getting together next week, to be with each other and bond even more, now that her brother is gone.  Energy and motivation are good.  ADLs and personal hygiene are normal.   Needs the Klonopin at times when she gets overwhelmed.  It does help. Tries not to take it  when she takes Adzenys. Appetite has not changed.  Weight is stable.   Denies suicidal or homicidal thoughts.  Not sleeping as well as she'd like, has been waking up having nightmares the past few weeks.  Her husband says she's screaming during the night.   States that attention is good without easy distractibility.  Able to focus on things and finish tasks to completion.   Patient denies increased energy with decreased need for sleep, increased talkativeness, racing thoughts, impulsivity or risky behaviors, increased spending, increased libido, grandiosity, increased irritability or anger, paranoia, or hallucinations.  Denies dizziness, syncope, seizures, numbness, tingling, tremor, tics, unsteady gait, slurred speech, confusion. Denies muscle or joint pain, stiffness, or dystonia. Denies unexplained weight loss, frequent infections, or sores that heal slowly.  No polyphagia, polydipsia, or polyuria.  Denies visual changes or paresthesias.   Individual Medical History/ Review of Systems: Changes? :No       Past medications for mental health diagnoses include: Lamictal, Seroquel, Klonopin, Adderall, Xanax, Prozac, Lexapro, melatonin, trazodone  Allergies: Aspirin and Nsaids  Current Medications:  Current Outpatient Medications:    Amphetamine ER (ADZENYS XR-ODT) 12.5 MG TBED, Take 12.5 mg by mouth 2 (two) times daily with breakfast and lunch., Disp: 60 tablet, Rfl: 0   Amphetamine ER (ADZENYS XR-ODT) 12.5 MG TBED, Take 12.5 mg by mouth 2 (two) times daily with breakfast and lunch., Disp: 60 tablet, Rfl: 0   [START ON 01/01/2024] Amphetamine ER (ADZENYS XR-ODT) 12.5 MG TBED, Take 12.5 mg by mouth 2 (two) times daily  with breakfast and lunch., Disp: 60 tablet, Rfl: 0   ergocalciferol (VITAMIN D2) 1.25 MG (50000 UT) capsule, Take 1 capsule (50,000 Units total) by mouth once a week., Disp: 12 capsule, Rfl: 1   lamoTRIgine (LAMICTAL) 200 MG tablet, TAKE 1 TABLET BY MOUTH DAILY  AFTER BREAKFAST,  Disp: 90 tablet, Rfl: 1   Multiple Vitamin (MULTIVITAMIN) tablet, Take 1 tablet by mouth daily., Disp: , Rfl:    QUEtiapine (SEROQUEL XR) 200 MG 24 hr tablet, TAKE 1 TABLET BY MOUTH AT  BEDTIME, Disp: 90 tablet, Rfl: 1   clonazePAM (KLONOPIN) 0.5 MG tablet, Take 1 tablet (0.5 mg total) by mouth 2 (two) times daily as needed. for anxiety, Disp: 180 tablet, Rfl: 0 Medication Side Effects: none  Family Medical/ Social History: Changes?   Brother was murdered on 10/24/2023  MENTAL HEALTH EXAM:  There were no vitals taken for this visit.There is no height or weight on file to calculate BMI.  General Appearance: Casual and Well Groomed  Eye Contact:  Good  Speech:  Clear and Coherent and Normal Rate  Volume:  Normal  Mood:   sad  Affect:  Tearful  Thought Process:  Goal Directed and Descriptions of Associations: Circumstantial  Orientation:  Full (Time, Place, and Person)  Thought Content: Logical   Suicidal Thoughts:  No  Homicidal Thoughts:  No  Memory:  WNL  Judgement:  Good  Insight:  Good  Psychomotor Activity:  Normal  Concentration:  Concentration: Good and Attention Span: Good  Recall:  Good  Fund of Knowledge: Good  Language: Good  Assets:  Communication Skills Desire for Improvement Financial Resources/Insurance Housing Resilience Transportation Vocational/Educational  ADL's:  Intact  Cognition: WNL  Prognosis:  Good   DIAGNOSES:    ICD-10-CM   1. Situational mixed anxiety and depressive disorder  F43.23     2. Bipolar II disorder, mild, depressed, with mixed features, in full remission (HCC)  F31.81 clonazePAM (KLONOPIN) 0.5 MG tablet    3. Generalized anxiety disorder  F41.1 clonazePAM (KLONOPIN) 0.5 MG tablet    4. Grief  F43.21     5. Night terrors  F51.4      Receiving Psychotherapy: No   RECOMMENDATIONS:  PDMP reviewed.  Last Adzenys XR ODT filled 12/03/2023.  Klonopin filled 09/25/2023. I provided 20 minutes of non-face to face time during this  encounter, including time spent before and after the visit in records review, medical decision making, counseling pertinent to today's visit, and charting.   We discussed the nightmares.  She may need prazosin or doxazosin but she prefers not to add on another medication at this time.  If the nightmares worsen or do not improve within the next few weeks, she can call and I will send in a prescription for low-dose prazosin.  Benefits, risks and side effects were discussed.  Hypotension precautions were given.  She understands.  May need to extend short-term disability but will reevaluate in her appointment next month.  Continue Adzenys XR ODT 12.5 mg, 1 p.o. at breakfast and 1 p.o. at lunch. Continue Klonopin 0.5 mg, 1 p.o. twice daily as needed.   Continue vitamin D 50,000 units once a week. Continue Lamictal 200 mg, 1 p.o. every morning. Continue Seroquel XR 200 mg, 1 p.o. nightly. Recommend counseling. Return in 4 weeks.  Melony Overly, PA-C

## 2024-01-02 ENCOUNTER — Telehealth: Payer: Self-pay

## 2024-01-02 NOTE — Telephone Encounter (Signed)
 Prior Authorization submitted for Adzenys XR ODT 12.5 mg with [facility] Pending response

## 2024-01-03 NOTE — Telephone Encounter (Signed)
 Response back from [facility] is a DENIAL, Per health plan's criteria, Adzeny's 12.5 mg is covered if pt meet the following: (1) One of the following: (A) There are paid claims or doctor provides medical records (for example: chart notes) showing tried or cannot use at least two covered drugs or cannot use all covered drugs (if only one or only two covered drugs are available, must have tried or cannot use all that are available). Covered drugs include: (I) Atomoxetine. (II) Concerta, Jornay PM, methylphenidate CD, methylphenidate ER capsule, methylphenidate ER, or methylphenidate ER 24hr capsule*. (III) Dexmethylphenidate ER. (IV) Lisdexamfetamine or brand Vyvanse   According to notes pt has only previously tried Adderall XR

## 2024-01-05 NOTE — Telephone Encounter (Signed)
 Noted.

## 2024-01-05 NOTE — Telephone Encounter (Signed)
 Patient uses a coupon for the Adzenys. Called to verify and she confirmed still using the coupon.

## 2024-01-05 NOTE — Telephone Encounter (Signed)
 Please contact pt to double check if she's tried any other stimulants. If not, let's try Vyvanse since it's the most closely related. Please pend 60 mg #30, 1 q am.  Let me know if she's tried others. My next choice would be Jornay PM. Thanks.

## 2024-01-27 ENCOUNTER — Telehealth (INDEPENDENT_AMBULATORY_CARE_PROVIDER_SITE_OTHER): Admitting: Physician Assistant

## 2024-01-27 ENCOUNTER — Encounter: Payer: Self-pay | Admitting: Physician Assistant

## 2024-01-27 DIAGNOSIS — F4321 Adjustment disorder with depressed mood: Secondary | ICD-10-CM | POA: Diagnosis not present

## 2024-01-27 DIAGNOSIS — F411 Generalized anxiety disorder: Secondary | ICD-10-CM | POA: Diagnosis not present

## 2024-01-27 DIAGNOSIS — F514 Sleep terrors [night terrors]: Secondary | ICD-10-CM | POA: Diagnosis not present

## 2024-01-27 DIAGNOSIS — F3181 Bipolar II disorder: Secondary | ICD-10-CM | POA: Diagnosis not present

## 2024-01-27 MED ORDER — LAMOTRIGINE 200 MG PO TABS: ORAL_TABLET | ORAL | 1 refills | Status: DC

## 2024-01-27 MED ORDER — ADZENYS XR-ODT 12.5 MG PO TBED: 12.5000 mg | EXTENDED_RELEASE_TABLET | Freq: Two times a day (BID) | ORAL | 0 refills | Status: DC

## 2024-01-27 MED ORDER — QUETIAPINE FUMARATE ER 200 MG PO TB24
200.0000 mg | ORAL_TABLET | Freq: Every day | ORAL | 1 refills | Status: DC
Start: 2024-01-27 — End: 2024-04-08

## 2024-01-27 NOTE — Progress Notes (Signed)
 [facility]  Patient ID: Andrea Oliver,  MRN: [id number]  PCP: System, Provider Not In  Date of Evaluation: 01/27/2024 Time spent:20 minutes  Chief Complaint:  Chief Complaint   Follow-up    Virtual Visit via Telehealth  I connected with patient by a video enabled telemedicine application with their informed consent, and verified patient privacy and that I am speaking with the correct person using two identifiers.  I am private, in my office and the patient is at home.  I discussed the limitations, risks, security and privacy concerns of performing an evaluation and management service by video and the availability of in person appointments. I also discussed with the patient that there may be a patient responsible charge related to this service. The patient expressed understanding and agreed to proceed.   I discussed the assessment and treatment plan with the patient. The patient was provided an opportunity to ask questions and all were answered. The patient agreed with the plan and demonstrated an understanding of the instructions.   The patient was advised to call back or seek an in-person evaluation if the symptoms worsen or if the condition fails to improve as anticipated.  I provided 20 minutes of non-face-to-face time during this encounter.  HISTORY/CURRENT STATUS: HPI For routine med check.    Under the circumstances, she's doing well on current meds. Night terrors have improved, but had court yesterday and that caused a lot of anxiety and sadness, which led to nightmares last night.  Needless to say it has been a hard few months since her brother was murdered.  She has gone to a couple of groups on grief counseling, but she has not found 1 that fits what she needs.  She is still looking for a group or individual counseling for the loss of a family member due to murder or other tragic events.  She does need the Klonopin  sometimes, more so at night.  She tries not to  take it anywhere near the time of the Adzenys  XR ODT.  She will go back to work in a couple of weeks.  She has mixed feelings about it.  She knows she has to go back sometime and although apprehensive, she feels it might do her good. Energy and motivation are good. ADLs and personal hygiene are normal.   Appetite has not changed.  Weight is stable.   Denies suicidal or homicidal thoughts.  States that attention is good without easy distractibility.  Able to focus on things and finish tasks to completion.   Denies dizziness, syncope, seizures, numbness, tingling, tremor, tics, unsteady gait, slurred speech, confusion. Denies muscle or joint pain, stiffness, or dystonia. Denies unexplained weight loss, frequent infections, or sores that heal slowly.  No polyphagia, polydipsia, or polyuria.  Denies visual changes or paresthesias.   Individual Medical History/ Review of Systems: Changes? :No       Past medications for mental health diagnoses include: Lamictal , Seroquel , Klonopin , Adderall, Xanax, Prozac , Lexapro, melatonin, trazodone   Allergies: Aspirin and Nsaids  Current Medications:  Current Outpatient Medications:    clonazePAM  (KLONOPIN ) 0.5 MG tablet, Take 1 tablet (0.5 mg total) by mouth 2 (two) times daily as needed. for anxiety, Disp: 180 tablet, Rfl: 0   ergocalciferol  (VITAMIN D2) 1.25 MG (50000 UT) capsule, Take 1 capsule (50,000 Units total) by mouth once a week., Disp: 12 capsule, Rfl: 1   Multiple Vitamin (MULTIVITAMIN) tablet, Take 1 tablet by mouth daily., Disp: , Rfl:    [START ON  01/29/2024] Amphetamine  ER (ADZENYS  XR-ODT) 12.5 MG TBED, Take 12.5 mg by mouth 2 (two) times daily with breakfast and lunch., Disp: 60 tablet, Rfl: 0   [START ON 02/28/2024] Amphetamine  ER (ADZENYS  XR-ODT) 12.5 MG TBED, Take 12.5 mg by mouth 2 (two) times daily with breakfast and lunch., Disp: 60 tablet, Rfl: 0   [START ON 03/28/2024] Amphetamine  ER (ADZENYS  XR-ODT) 12.5 MG TBED, Take 12.5 mg by mouth 2 (two)  times daily with breakfast and lunch., Disp: 60 tablet, Rfl: 0   lamoTRIgine  (LAMICTAL ) 200 MG tablet, TAKE 1 TABLET BY MOUTH DAILY  AFTER BREAKFAST, Disp: 90 tablet, Rfl: 1   QUEtiapine  (SEROQUEL  XR) 200 MG 24 hr tablet, Take 1 tablet (200 mg total) by mouth at bedtime., Disp: 90 tablet, Rfl: 1 Medication Side Effects: none  Family Medical/ Social History: Changes?   Brother was murdered on 10/24/2023  MENTAL HEALTH EXAM:  There were no vitals taken for this visit.There is no height or weight on file to calculate BMI.  General Appearance: Casual and Well Groomed  Eye Contact:  Good  Speech:  Clear and Coherent and Normal Rate  Volume:  Normal  Mood:   sad  Affect:  Congruent  Thought Process:  Goal Directed and Descriptions of Associations: Circumstantial  Orientation:  Full (Time, Place, and Person)  Thought Content: Logical   Suicidal Thoughts:  No  Homicidal Thoughts:  No  Memory:  WNL  Judgement:  Good  Insight:  Good  Psychomotor Activity:  Normal  Concentration:  Concentration: Good and Attention Span: Good  Recall:  Good  Fund of Knowledge: Good  Language: Good  Assets:  Communication Skills Desire for Improvement Financial Resources/Insurance Housing Resilience Transportation Vocational/Educational  ADL's:  Intact  Cognition: WNL  Prognosis:  Good   DIAGNOSES:    ICD-10-CM   1. Bipolar II disorder, mild, depressed, with mixed features, in full remission (HCC)  F31.81 lamoTRIgine  (LAMICTAL ) 200 MG tablet    QUEtiapine  (SEROQUEL  XR) 200 MG 24 hr tablet    2. Generalized anxiety disorder  F41.1 QUEtiapine  (SEROQUEL  XR) 200 MG 24 hr tablet    3. Night terrors  F51.4     4. Grief  F43.21      Receiving Psychotherapy: No   RECOMMENDATIONS:  PDMP reviewed.  Last Adzenys  XR ODT filled 01/01/2024. Klonopin  filled 12/30/2023.  I provided 20 minutes of non-face-to-face time during this encounter, including time spent before and after the visit in records review,  medical decision making, counseling pertinent to today's visit, and charting.   She is doing as well as can be expected on current meds so no changes will be made.  We again discussed the fact that she is on a stimulant and a benzo.  She needs both under the circumstances.  RTW on 02/09/2024.  It is okay to send a written note stating that if she needs it after discussing with HR.  If she needs to be out longer I am fine with that as well.  She is still dealing with a lot of grief.  Continue Adzenys  XR ODT 12.5 mg, 1 p.o. at breakfast and 1 p.o. at lunch. Continue Klonopin  0.5 mg, 1 p.o. twice daily as needed.   Continue vitamin D  50,000 units once a week. Continue Lamictal  200 mg, 1 p.o. every morning. Continue Seroquel  XR 200 mg, 1 p.o. nightly. Recommend counseling. Return in mid-July.  Marvia Slocumb, PA-C

## 2024-01-31 ENCOUNTER — Other Ambulatory Visit: Payer: Self-pay | Admitting: Physician Assistant

## 2024-01-31 DIAGNOSIS — F3181 Bipolar II disorder: Secondary | ICD-10-CM

## 2024-01-31 DIAGNOSIS — F411 Generalized anxiety disorder: Secondary | ICD-10-CM

## 2024-03-16 ENCOUNTER — Other Ambulatory Visit: Payer: Self-pay | Admitting: Physician Assistant

## 2024-03-16 DIAGNOSIS — F411 Generalized anxiety disorder: Secondary | ICD-10-CM

## 2024-03-16 DIAGNOSIS — F3181 Bipolar II disorder: Secondary | ICD-10-CM

## 2024-04-08 ENCOUNTER — Encounter: Payer: Self-pay | Admitting: Physician Assistant

## 2024-04-08 ENCOUNTER — Telehealth: Admitting: Physician Assistant

## 2024-04-08 DIAGNOSIS — F3181 Bipolar II disorder: Secondary | ICD-10-CM

## 2024-04-08 DIAGNOSIS — F902 Attention-deficit hyperactivity disorder, combined type: Secondary | ICD-10-CM

## 2024-04-08 DIAGNOSIS — F411 Generalized anxiety disorder: Secondary | ICD-10-CM

## 2024-04-08 DIAGNOSIS — F4321 Adjustment disorder with depressed mood: Secondary | ICD-10-CM

## 2024-04-08 DIAGNOSIS — G4700 Insomnia, unspecified: Secondary | ICD-10-CM

## 2024-04-08 MED ORDER — QUETIAPINE FUMARATE ER 200 MG PO TB24: 200.0000 mg | ORAL_TABLET | Freq: Every day | ORAL | 1 refills | Status: DC

## 2024-04-08 MED ORDER — ADZENYS XR-ODT 12.5 MG PO TBED: 12.5000 mg | EXTENDED_RELEASE_TABLET | Freq: Two times a day (BID) | ORAL | 0 refills | Status: DC

## 2024-04-08 NOTE — Progress Notes (Signed)
 [facility]  Patient ID: Andrea Oliver,  MRN: [id number]  PCP: System, Provider Not In  Date of Evaluation: 04/08/2024 Time spent:20 minutes  Chief Complaint:  Chief Complaint   Anxiety; Depression; ADHD; Follow-up    Virtual Visit via Telehealth  I connected with patient by a video enabled telemedicine application with their informed consent, and verified patient privacy and that I am speaking with the correct person using two identifiers.  I am private, in my office and the patient is at home.  I discussed the limitations, risks, security and privacy concerns of performing an evaluation and management service by video and the availability of in person appointments. I also discussed with the patient that there may be a patient responsible charge related to this service. The patient expressed understanding and agreed to proceed.   I discussed the assessment and treatment plan with the patient. The patient was provided an opportunity to ask questions and all were answered. The patient agreed with the plan and demonstrated an understanding of the instructions.   The patient was advised to call back or seek an in-person evaluation if the symptoms worsen or if the condition fails to improve as anticipated.  I provided 20 minutes of non-face-to-face time during this encounter.  HISTORY/CURRENT STATUS: HPI For routine med check.    Back at work. It's been good, her boss gave her a project that she feels comfortable with and likes being busy.  It helps keep her mind off grieving and dealing with the court system.  Still unsure of the court date for the person who killed her brother. She feels that when all that's behind her, she'll have some peace, at least as well as she can.   She's gotten together with some family members and friends over the past few months.  It's been good to talk things out, and enjoy some time away.  Energy and motivation are good most of the time. Still  cries easily when thinking about her brother.  Sleeps well most of the time. ADLs and personal hygiene are normal.    Appetite has not changed.  Weight is stable.  No mania, delirium, AH/VH.  No SI/HI.  Klonopin  and still working well when needed.  She tries not to take it often.  Not having panic attacks as much as she has a sense of uneasiness at times.  It is getting better though.  Adzenys  XR ODT is still effective. States that attention is good without easy distractibility.  Able to focus on things and finish tasks to completion.   Denies dizziness, syncope, seizures, numbness, tingling, tremor, tics, unsteady gait, slurred speech, confusion. Denies muscle or joint pain, stiffness, or dystonia. Denies unexplained weight loss, frequent infections, or sores that heal slowly.  No polyphagia, polydipsia, or polyuria. Denies visual changes or paresthesias.    Individual Medical History/ Review of Systems: Changes? :No       Past medications for mental health diagnoses include: Lamictal , Seroquel , Klonopin , Adderall, Xanax, Prozac , Lexapro, melatonin, trazodone   Allergies: Aspirin and Nsaids  Current Medications:  Current Outpatient Medications:    clonazePAM  (KLONOPIN ) 0.5 MG tablet, TAKE 1 TABLET BY MOUTH TWICE  DAILY AS NEEDED FOR ANXIETY, Disp: 180 tablet, Rfl: 0   ergocalciferol  (VITAMIN D2) 1.25 MG (50000 UT) capsule, Take 1 capsule (50,000 Units total) by mouth once a week., Disp: 12 capsule, Rfl: 1   lamoTRIgine  (LAMICTAL ) 200 MG tablet, TAKE 1 TABLET BY MOUTH DAILY  AFTER BREAKFAST, Disp: 90 tablet,  Rfl: 1   Multiple Vitamin (MULTIVITAMIN) tablet, Take 1 tablet by mouth daily., Disp: , Rfl:    [START ON 05/01/2024] Amphetamine  ER (ADZENYS  XR-ODT) 12.5 MG TBED, Take 12.5 mg by mouth 2 (two) times daily with breakfast and lunch., Disp: 60 tablet, Rfl: 0   [START ON 05/31/2024] Amphetamine  ER (ADZENYS  XR-ODT) 12.5 MG TBED, Take 12.5 mg by mouth 2 (two) times daily with breakfast and lunch.,  Disp: 60 tablet, Rfl: 0   [START ON 06/29/2024] Amphetamine  ER (ADZENYS  XR-ODT) 12.5 MG TBED, Take 12.5 mg by mouth 2 (two) times daily with breakfast and lunch., Disp: 60 tablet, Rfl: 0   QUEtiapine  (SEROQUEL  XR) 200 MG 24 hr tablet, Take 1 tablet (200 mg total) by mouth at bedtime., Disp: 90 tablet, Rfl: 1 Medication Side Effects: none  Family Medical/ Social History: Changes?    Back at work.  MENTAL HEALTH EXAM:  There were no vitals taken for this visit.There is no height or weight on file to calculate BMI.  General Appearance: Casual and Well Groomed  Eye Contact:  Good  Speech:  Clear and Coherent and Normal Rate  Volume:  Normal  Mood:  Euthymic  Affect:  Congruent  Thought Process:  Goal Directed and Descriptions of Associations: Circumstantial  Orientation:  Full (Time, Place, and Person)  Thought Content: Logical   Suicidal Thoughts:  No  Homicidal Thoughts:  No  Memory:  WNL  Judgement:  Good  Insight:  Good  Psychomotor Activity:  Normal  Concentration:  Concentration: Good and Attention Span: Good  Recall:  Good  Fund of Knowledge: Good  Language: Good  Assets:  Communication Skills Desire for Improvement Financial Resources/Insurance Housing Resilience Transportation Vocational/Educational  ADL's:  Intact  Cognition: WNL  Prognosis:  Good   PCP follows labs.  DIAGNOSES:    ICD-10-CM   1. Bipolar II disorder, mild, depressed, with mixed features, in full remission (HCC)  F31.81 QUEtiapine  (SEROQUEL  XR) 200 MG 24 hr tablet    2. Generalized anxiety disorder  F41.1 QUEtiapine  (SEROQUEL  XR) 200 MG 24 hr tablet    3. Attention deficit hyperactivity disorder (ADHD), combined type, mild  F90.2     4. Grief  F43.21     5. Insomnia, unspecified type  G47.00       Receiving Psychotherapy: No   RECOMMENDATIONS:  PDMP reviewed.  Last Adzenys  XR ODT filled 04/01/2024. I provided approximately 20 minutes of non-face-to-face time during this encounter,  including time spent before and after the visit in records review, medical decision making, counseling pertinent to today's visit, and charting.   Overall she is doing well with the current treatment so no changes are needed.  Continue Adzenys  XR ODT 12.5 mg, 1 p.o. at breakfast and 1 p.o. at lunch. Continue Klonopin  0.5 mg, 1 p.o. twice daily as needed.   Continue vitamin D  50,000 units once a week. Continue Lamictal  200 mg, 1 p.o. every morning. Continue Seroquel  XR 200 mg, 1 p.o. nightly. Recommend counseling.  Return in 4 months.  Verneita Cooks, PA-C

## 2024-05-10 LAB — COLOGUARD: COLOGUARD: NEGATIVE

## 2024-06-04 ENCOUNTER — Other Ambulatory Visit: Payer: Self-pay | Admitting: Physician Assistant

## 2024-06-04 DIAGNOSIS — F3181 Bipolar II disorder: Secondary | ICD-10-CM

## 2024-06-04 DIAGNOSIS — F411 Generalized anxiety disorder: Secondary | ICD-10-CM

## 2024-06-04 NOTE — Telephone Encounter (Signed)
 LF 6/27 x90 days, due 9/23.

## 2024-07-26 ENCOUNTER — Other Ambulatory Visit: Payer: Self-pay | Admitting: Physician Assistant

## 2024-07-26 DIAGNOSIS — F3181 Bipolar II disorder: Secondary | ICD-10-CM

## 2024-08-04 ENCOUNTER — Telehealth: Payer: Self-pay | Admitting: Physician Assistant

## 2024-08-04 NOTE — Telephone Encounter (Signed)
 PT requested RF on Adzenys  Rx.  Made appt for 12/23 to f/up.  Please send in RF to: [facility] [place], [place] - [id number] [place].

## 2024-08-05 ENCOUNTER — Other Ambulatory Visit: Payer: Self-pay | Admitting: Physician Assistant

## 2024-08-05 MED ORDER — AMPHETAMINE ER 12.5 MG PO TBED: 12.5000 mg | EXTENDED_RELEASE_TABLET | Freq: Two times a day (BID) | ORAL | 0 refills | Status: DC

## 2024-08-05 MED ORDER — AMPHETAMINE ER 12.5 MG PO TBED: 12.5000 mg | EXTENDED_RELEASE_TABLET | Freq: Two times a day (BID) | ORAL | 0 refills | Status: AC

## 2024-08-05 NOTE — Telephone Encounter (Signed)
 sent

## 2024-08-25 ENCOUNTER — Other Ambulatory Visit: Payer: Self-pay | Admitting: Physician Assistant

## 2024-08-25 DIAGNOSIS — F3181 Bipolar II disorder: Secondary | ICD-10-CM

## 2024-08-25 DIAGNOSIS — F411 Generalized anxiety disorder: Secondary | ICD-10-CM

## 2024-09-14 ENCOUNTER — Encounter: Payer: Self-pay | Admitting: Physician Assistant

## 2024-09-14 ENCOUNTER — Telehealth: Admitting: Physician Assistant

## 2024-09-14 DIAGNOSIS — F4321 Adjustment disorder with depressed mood: Secondary | ICD-10-CM

## 2024-09-14 DIAGNOSIS — F411 Generalized anxiety disorder: Secondary | ICD-10-CM | POA: Diagnosis not present

## 2024-09-14 DIAGNOSIS — F902 Attention-deficit hyperactivity disorder, combined type: Secondary | ICD-10-CM

## 2024-09-14 DIAGNOSIS — G4700 Insomnia, unspecified: Secondary | ICD-10-CM

## 2024-09-14 DIAGNOSIS — F3181 Bipolar II disorder: Secondary | ICD-10-CM | POA: Diagnosis not present

## 2024-09-14 MED ORDER — AMPHETAMINE ER 12.5 MG PO TBED: 12.5000 mg | EXTENDED_RELEASE_TABLET | Freq: Two times a day (BID) | ORAL | 0 refills | Status: AC

## 2024-09-14 NOTE — Progress Notes (Signed)
 "     [facility]  Patient ID: Andrea Oliver,  MRN: [id number]  PCP: System, Provider Not In  Date of Evaluation: 09/14/2024 Time spent:20 minutes  Chief Complaint:  Chief Complaint   ADHD; Anxiety; Depression; Follow-up    Virtual Visit via Telehealth  I connected with patient by a video enabled telemedicine application with their informed consent, and verified patient privacy and that I am speaking with the correct person using two identifiers.  I am private, in my office and the patient is at home.  I discussed the limitations, risks, security and privacy concerns of performing an evaluation and management service by video and the availability of in person appointments. I also discussed with the patient that there may be a patient responsible charge related to this service. The patient expressed understanding and agreed to proceed.   I discussed the assessment and treatment plan with the patient. The patient was provided an opportunity to ask questions and all were answered. The patient agreed with the plan and demonstrated an understanding of the instructions.   The patient was advised to call back or seek an in-person evaluation if the symptoms worsen or if the condition fails to improve as anticipated.  I provided 20 minutes of non-face-to-face time during this encounter.  HISTORY/CURRENT STATUS: HPI For routine med check.    She's doing well as far as her meds go.  Of course she's still grieving the loss of her brother who was murdered in January. Handling it as best she can.  Her energy and motivation are good.  Work is going well.   No feelings of hopelessness.  Sleeps well most of the time. ADLs and personal hygiene are normal.  States that attention is good without easy distractibility.  Able to focus on things and finish tasks to completion. No change in memory.  Appetite has not changed.  Weight is stable.  No mania, delirium, AH/VH.  No SI/HI.  Individual Medical  History/ Review of Systems: Changes? :No       Past medications for mental health diagnoses include: Lamictal , Seroquel , Klonopin , Adderall, Xanax, Prozac , Lexapro, melatonin, trazodone   Allergies: Aspirin and Nsaids  Current Medications:  Current Outpatient Medications:    [START ON 10/03/2024] Amphetamine  ER (ADZENYS  XR-ODT) 12.5 MG TBED, Take 12.5 mg by mouth 2 (two) times daily with breakfast and lunch., Disp: 60 tablet, Rfl: 0   clonazePAM  (KLONOPIN ) 0.5 MG tablet, TAKE 1 TABLET BY MOUTH TWICE  DAILY AS NEEDED FOR ANXIETY, Disp: 180 tablet, Rfl: 1   lamoTRIgine  (LAMICTAL ) 200 MG tablet, TAKE 1 TABLET BY MOUTH DAILY  AFTER BREAKFAST, Disp: 90 tablet, Rfl: 0   Multiple Vitamin (MULTIVITAMIN) tablet, Take 1 tablet by mouth daily., Disp: , Rfl:    QUEtiapine  (SEROQUEL  XR) 200 MG 24 hr tablet, TAKE 1 TABLET BY MOUTH AT  BEDTIME, Disp: 90 tablet, Rfl: 0   [START ON 11/02/2024] Amphetamine  ER (ADZENYS  XR-ODT) 12.5 MG TBED, Take 12.5 mg by mouth 2 (two) times daily with breakfast and lunch., Disp: 60 tablet, Rfl: 0   [START ON 11/29/2024] Amphetamine  ER (ADZENYS  XR-ODT) 12.5 MG TBED, Take 12.5 mg by mouth 2 (two) times daily with breakfast and lunch., Disp: 60 tablet, Rfl: 0   ergocalciferol  (VITAMIN D2) 1.25 MG (50000 UT) capsule, Take 1 capsule (50,000 Units total) by mouth once a week. (Patient not taking: Reported on 09/14/2024), Disp: 12 capsule, Rfl: 1 Medication Side Effects: none  Family Medical/ Social History: Changes?   No  MENTAL HEALTH EXAM:  There were no vitals taken for this visit.There is no height or weight on file to calculate BMI.  General Appearance: Casual and Well Groomed  Eye Contact:  Good  Speech:  Clear and Coherent and Normal Rate  Volume:  Normal  Mood:  Euthymic  Affect:  Congruent  Thought Process:  Goal Directed and Descriptions of Associations: Circumstantial  Orientation:  Full (Time, Place, and Person)  Thought Content: Logical   Suicidal Thoughts:  No   Homicidal Thoughts:  No  Memory:  WNL  Judgement:  Good  Insight:  Good  Psychomotor Activity:  Normal  Concentration:  Concentration: Good and Attention Span: Good  Recall:  Good  Fund of Knowledge: Good  Language: Good  Assets:  Communication Skills Desire for Improvement Financial Resources/Insurance Housing Resilience Social Support Transportation Vocational/Educational  ADL's:  Intact  Cognition: WNL  Prognosis:  Good   PCP follows labs.  DIAGNOSES:    ICD-10-CM   1. Bipolar II disorder, mild, depressed, with mixed features, in full remission (HCC)  F31.81     2. Generalized anxiety disorder  F41.1     3. Attention deficit hyperactivity disorder (ADHD), combined type, mild  F90.2     4. Insomnia, unspecified type  G47.00     5. Grief  F43.21       Receiving Psychotherapy: No   RECOMMENDATIONS:  PDMP reviewed.  Last Adzenys  XR ODT filled 09/07/2024. I provided approximately 20  minutes of non-face to face time during this encounter, including time spent before and after the visit in records review, medical decision making, counseling pertinent to today's visit, and charting.   She's doing well overall.  No change in treatment.   Continue Adzenys  XR ODT 12.5 mg, 1 p.o. at breakfast and 1 p.o. at lunch. Continue Klonopin  0.5 mg, 1 p.o. twice daily as needed.  Take as sparingly as possible.  Continue vitamin D  50,000 units once a week. Continue Lamictal  200 mg, 1 p.o. every morning. Continue Seroquel  XR 200 mg, 1 p.o. nightly. Return in 6 months.    Verneita Cooks, PA-C  "

## 2024-10-19 ENCOUNTER — Other Ambulatory Visit: Payer: Self-pay | Admitting: Physician Assistant

## 2024-10-19 DIAGNOSIS — F3181 Bipolar II disorder: Secondary | ICD-10-CM
